# Patient Record
Sex: Female | Born: 1995
Health system: Southern US, Community
[De-identification: ages and names within clinical notes are randomized; demographics above are authoritative.]

## PROBLEM LIST (undated history)

## (undated) DIAGNOSIS — R519 Headache, unspecified: Secondary | ICD-10-CM

## (undated) DIAGNOSIS — R51 Headache: Secondary | ICD-10-CM

## (undated) HISTORY — DX: Headache: R51

## (undated) HISTORY — DX: Headache, unspecified: R51.9

---

## 2015-07-20 ENCOUNTER — Emergency Department (HOSPITAL_COMMUNITY)
Admission: EM | Admit: 2015-07-20 | Discharge: 2015-07-20 | Disposition: A | Discharge status: Home / Self Care | Payer: Self-pay | Source: Home / Self Care | Attending: Family Medicine | Admitting: Family Medicine

## 2015-07-20 ENCOUNTER — Encounter (HOSPITAL_COMMUNITY): Payer: Self-pay | Admitting: Emergency Medicine

## 2015-07-20 DIAGNOSIS — J069 Acute upper respiratory infection, unspecified: Secondary | ICD-10-CM

## 2015-07-20 LAB — POCT RAPID STREP A: STREPTOCOCCUS, GROUP A SCREEN (DIRECT): NEGATIVE

## 2015-07-20 MED ORDER — IPRATROPIUM BROMIDE 0.06 % NA SOLN: 2.0000 | Freq: Four times a day (QID) | NASAL | Status: DC

## 2015-07-20 MED ORDER — IBUPROFEN 800 MG PO TABS: 800.0000 mg | ORAL_TABLET | Freq: Three times a day (TID) | ORAL | Status: DC | PRN

## 2015-07-20 MED ORDER — BENZOCAINE (TOPICAL) 20 % EX AERO: 1.0000 "application " | INHALATION_SPRAY | Freq: Four times a day (QID) | CUTANEOUS | Status: DC | PRN

## 2015-07-20 NOTE — ED Notes (Signed)
Pt states she woke up in the middle of the night with a sore throat.  Since that time she is now suffering from nasal congestion, ear & head pressure, chest tightness, and generalized body weakness.  Pt denies any fever, but does state she had chills and sweats today.

## 2015-07-20 NOTE — ED Provider Notes (Signed)
CSN: [id number]     Arrival date & time 07/20/15  1523 History   First MD Initiated Contact with Patient 07/20/15 1736     Chief Complaint  Patient presents with  . Sore Throat  . URI   (Consider location/radiation/quality/duration/timing/severity/associated sxs/prior Treatment) HPI    Sore throat started at 06:00 today. Constant. Getting worse. Little po due to pain. Works at call center which makes it worse. Halls and hot tea and salt water w/o benefit.  Associated w/ runny nose and pressure in ears. HA feelign weak in general.  No fevers.     History reviewed. No pertinent past medical history. History reviewed. No pertinent past surgical history. Family History  Problem Relation Age of Onset  . Hypertension Other    Social History  Substance Use Topics  . Smoking status: Never Smoker   . Smokeless tobacco: None  . Alcohol Use: No   OB History    No data available     Review of Systems Per HPI with all other pertinent systems negative.   Allergies  Review of patient's allergies indicates no known allergies.  Home Medications   Prior to Admission medications   Medication Sig Start Date End Date Taking? Authorizing Provider  benzocaine (HURRICAINE) 20 % oral spray Use as directed 1 application in the mouth or throat 4 (four) times daily as needed for throat irritation / pain. 07/20/15   Ozella Rocks, MD  ibuprofen (ADVIL,MOTRIN) 800 MG tablet Take 1 tablet (800 mg total) by mouth every 8 (eight) hours as needed. 07/20/15   Ozella Rocks, MD  ipratropium (ATROVENT) 0.06 % nasal spray Place 2 sprays into both nostrils 4 (four) times daily. 07/20/15   Ozella Rocks, MD   Meds Ordered and Administered this Visit  Medications - No data to display  BP 94/64 mmHg  Pulse 111  Temp(Src) 99.6 F (37.6 C) (Oral)  Resp 16  SpO2 100%  LMP 07/15/2015 (Within Days) No data found.   Physical Exam Physical Exam  Constitutional: oriented to person, place, and time.  appears well-developed and well-nourished. No distress.  HENT:  Head: Normocephalic and atraumatic.  Eyes: EOMI. PERRL.  Minimal pharyngeal injection, tonsils 0 without exudate, patient actively sniffling in exam room. Neck: Normal range of motion.  Cardiovascular: RRR, no m/r/g, 2+ distal pulses,  Pulmonary/Chest: Effort normal and breath sounds normal. No respiratory distress.  Abdominal: Soft. Bowel sounds are normal. NonTTP, no distension.  Musculoskeletal: Normal range of motion. Non ttp, no effusion.  Neurological: alert and oriented to person, place, and time.  Skin: Skin is warm. No rash noted. non diaphoretic.  Psychiatric: normal mood and affect. behavior is normal. Judgment and thought content normal.   ED Course  Procedures (including critical care time)  Labs Review Labs Reviewed  POCT RAPID STREP A    Imaging Review No results found.   Visual Acuity Review  Right Eye Distance:   Left Eye Distance:   Bilateral Distance:    Right Eye Near:   Left Eye Near:    Bilateral Near:         MDM   1. Viral URI    Rapid strep negative  Strep Cx sent Atrovent, motrin, allergy medicine, hurricaine spray    Ozella Rocks, MD 07/20/15 [id number]

## 2015-07-20 NOTE — Discharge Instructions (Signed)
Your strep test was negative. You likely have a viral cold. Please start the nasal Atrovent, Hurricaine spray, Motrin 800 for symptoms. Please also consider using over-the-counter allergy medicine such as Zyrtec or Allegra at night before bed. We will call you if your second strep test comes back positive for antibiotic therapy.

## 2015-07-22 LAB — CULTURE, GROUP A STREP: Strep A Culture: NEGATIVE

## 2015-07-23 NOTE — ED Notes (Signed)
Final report of strep testing negative  

## 2018-07-13 DIAGNOSIS — Z3201 Encounter for pregnancy test, result positive: Secondary | ICD-10-CM | POA: Diagnosis not present

## 2018-07-13 DIAGNOSIS — Z3481 Encounter for supervision of other normal pregnancy, first trimester: Secondary | ICD-10-CM | POA: Diagnosis not present

## 2018-07-16 LAB — OB RESULTS CONSOLE GC/CHLAMYDIA
Chlamydia: NEGATIVE
Gonorrhea: NEGATIVE

## 2018-07-16 LAB — OB RESULTS CONSOLE RUBELLA ANTIBODY, IGM: Rubella: IMMUNE

## 2018-07-16 LAB — OB RESULTS CONSOLE ABO/RH: RH Type: POSITIVE

## 2018-07-16 LAB — OB RESULTS CONSOLE RPR: RPR: NONREACTIVE

## 2018-07-16 LAB — OB RESULTS CONSOLE HIV ANTIBODY (ROUTINE TESTING): HIV: NONREACTIVE

## 2018-07-16 LAB — OB RESULTS CONSOLE ANTIBODY SCREEN: Antibody Screen: NEGATIVE

## 2018-07-16 LAB — OB RESULTS CONSOLE HEPATITIS B SURFACE ANTIGEN: Hepatitis B Surface Ag: NEGATIVE

## 2018-07-19 DIAGNOSIS — Z3481 Encounter for supervision of other normal pregnancy, first trimester: Secondary | ICD-10-CM | POA: Diagnosis not present

## 2018-07-20 DIAGNOSIS — Z3687 Encounter for antenatal screening for uncertain dates: Secondary | ICD-10-CM | POA: Diagnosis not present

## 2018-08-16 DIAGNOSIS — Z23 Encounter for immunization: Secondary | ICD-10-CM | POA: Diagnosis not present

## 2018-09-18 DIAGNOSIS — Z36 Encounter for antenatal screening for chromosomal anomalies: Secondary | ICD-10-CM | POA: Diagnosis not present

## 2018-09-18 DIAGNOSIS — Z3482 Encounter for supervision of other normal pregnancy, second trimester: Secondary | ICD-10-CM | POA: Diagnosis not present

## 2018-10-24 NOTE — L&D Delivery Note (Signed)
Operative Delivery Note At 8:31 AM a viable female was delivered via Vaginal, Vacuum [employer] banker, operational).  Presentation: vertex; Position: Left,, Occiput,, Anterior; Station: +3.  Verbal consent: obtained from patient.  Risks and benefits discussed in detail.  Risks include, but are not limited to the risks of anesthesia, bleeding, infection, damage to maternal tissues, fetal cephalhematoma.  There is also the risk of inability to effect vaginal delivery of the head, or shoulder dystocia that cannot be resolved by established maneuvers, leading to the need for emergency cesarean section.  Vacuum applied- one pull with delivery of fetal head.  Minor shoulder dystocia noted.  Rotation performed with delivery of posterior arm.  Less than 1 min from fetal head to delivery of baby  APGAR: 9, 9; weight: pending Placenta status: path   Cord:  with the following complications: none.  Cord pH: n/a  Anesthesia:  epidural Instruments: Bell Episiotomy: None Lacerations: 2nd degree;Perineal Suture Repair: 2.0 3.0 vicryl Est. Blood Loss (mL): 173cc  Mom to postpartum.  Baby to Couplet care / Skin to Skin.  Alessandra Bevels Heylee Tant 02/11/2019, 9:25 AM

## 2018-11-06 DIAGNOSIS — Z3482 Encounter for supervision of other normal pregnancy, second trimester: Secondary | ICD-10-CM | POA: Diagnosis not present

## 2018-11-06 DIAGNOSIS — Z8349 Family history of other endocrine, nutritional and metabolic diseases: Secondary | ICD-10-CM | POA: Diagnosis not present

## 2018-11-12 DIAGNOSIS — Z3482 Encounter for supervision of other normal pregnancy, second trimester: Secondary | ICD-10-CM | POA: Diagnosis not present

## 2018-12-07 DIAGNOSIS — Z23 Encounter for immunization: Secondary | ICD-10-CM | POA: Diagnosis not present

## 2018-12-19 DIAGNOSIS — J029 Acute pharyngitis, unspecified: Secondary | ICD-10-CM | POA: Diagnosis not present

## 2018-12-21 DIAGNOSIS — Z3483 Encounter for supervision of other normal pregnancy, third trimester: Secondary | ICD-10-CM | POA: Diagnosis not present

## 2019-01-29 DIAGNOSIS — O26843 Uterine size-date discrepancy, third trimester: Secondary | ICD-10-CM | POA: Diagnosis not present

## 2019-02-05 ENCOUNTER — Telehealth (HOSPITAL_COMMUNITY): Payer: Self-pay | Admitting: *Deleted

## 2019-02-05 ENCOUNTER — Encounter (HOSPITAL_COMMUNITY): Payer: Self-pay | Admitting: *Deleted

## 2019-02-05 LAB — OB RESULTS CONSOLE GBS: GBS: NEGATIVE

## 2019-02-05 NOTE — Telephone Encounter (Signed)
Preadmission screen  

## 2019-02-10 ENCOUNTER — Encounter (HOSPITAL_COMMUNITY): Payer: Self-pay

## 2019-02-10 ENCOUNTER — Other Ambulatory Visit: Payer: Self-pay

## 2019-02-10 ENCOUNTER — Inpatient Hospital Stay (HOSPITAL_COMMUNITY): Payer: BLUE CROSS/BLUE SHIELD | Admitting: Anesthesiology

## 2019-02-10 ENCOUNTER — Inpatient Hospital Stay (HOSPITAL_COMMUNITY)
Admission: AD | Admit: 2019-02-10 | Discharge: 2019-02-13 | DRG: 807 | Disposition: A | Discharge status: Home / Self Care | Payer: BLUE CROSS/BLUE SHIELD | Attending: Obstetrics & Gynecology | Admitting: Obstetrics & Gynecology

## 2019-02-10 DIAGNOSIS — O99354 Diseases of the nervous system complicating childbirth: Secondary | ICD-10-CM | POA: Diagnosis not present

## 2019-02-10 DIAGNOSIS — Z3A40 40 weeks gestation of pregnancy: Secondary | ICD-10-CM | POA: Diagnosis not present

## 2019-02-10 DIAGNOSIS — O411290 Chorioamnionitis, unspecified trimester, not applicable or unspecified: Secondary | ICD-10-CM | POA: Diagnosis not present

## 2019-02-10 DIAGNOSIS — R51 Headache: Secondary | ICD-10-CM | POA: Diagnosis not present

## 2019-02-10 DIAGNOSIS — Z3A00 Weeks of gestation of pregnancy not specified: Secondary | ICD-10-CM | POA: Diagnosis not present

## 2019-02-10 DIAGNOSIS — O26893 Other specified pregnancy related conditions, third trimester: Secondary | ICD-10-CM | POA: Diagnosis not present

## 2019-02-10 DIAGNOSIS — O368390 Maternal care for abnormalities of the fetal heart rate or rhythm, unspecified trimester, not applicable or unspecified: Secondary | ICD-10-CM | POA: Diagnosis not present

## 2019-02-10 DIAGNOSIS — O99214 Obesity complicating childbirth: Secondary | ICD-10-CM | POA: Diagnosis not present

## 2019-02-10 DIAGNOSIS — Z23 Encounter for immunization: Secondary | ICD-10-CM | POA: Diagnosis not present

## 2019-02-10 LAB — URINALYSIS, ROUTINE W REFLEX MICROSCOPIC
Bilirubin Urine: NEGATIVE
Glucose, UA: NEGATIVE mg/dL
Hgb urine dipstick: NEGATIVE
Ketones, ur: 80 mg/dL — AB
Leukocytes,Ua: NEGATIVE
Nitrite: NEGATIVE
Protein, ur: NEGATIVE mg/dL
Specific Gravity, Urine: 1.014 (ref 1.005–1.030)
pH: 6 (ref 5.0–8.0)

## 2019-02-10 LAB — CBC
HCT: 38.6 % (ref 36.0–46.0)
Hemoglobin: 12.3 g/dL (ref 12.0–15.0)
MCH: 29.1 pg (ref 26.0–34.0)
MCHC: 31.9 g/dL (ref 30.0–36.0)
MCV: 91.3 fL (ref 80.0–100.0)
Platelets: 202 10*3/uL (ref 150–400)
RBC: 4.23 MIL/uL (ref 3.87–5.11)
RDW: 13.8 % (ref 11.5–15.5)
WBC: 11.2 10*3/uL — ABNORMAL HIGH (ref 4.0–10.5)
nRBC: 0 % (ref 0.0–0.2)

## 2019-02-10 LAB — TYPE AND SCREEN
ABO/RH(D): A POS
Antibody Screen: NEGATIVE

## 2019-02-10 MED ORDER — PHENYLEPHRINE 40 MCG/ML (10ML) SYRINGE FOR IV PUSH (FOR BLOOD PRESSURE SUPPORT): 80.0000 ug | PREFILLED_SYRINGE | INTRAVENOUS | Status: DC | PRN

## 2019-02-10 MED ORDER — LIDOCAINE HCL (PF) 1 % IJ SOLN
INTRAMUSCULAR | Status: DC | PRN
  Administered 2019-02-10 (×2): 5 mL via EPIDURAL

## 2019-02-10 MED ORDER — OXYCODONE-ACETAMINOPHEN 5-325 MG PO TABS: 1.0000 | ORAL_TABLET | ORAL | Status: DC | PRN

## 2019-02-10 MED ORDER — OXYTOCIN BOLUS FROM INFUSION
500.0000 mL | Freq: Once | INTRAVENOUS | Status: AC
  Administered 2019-02-11: 500 mL via INTRAVENOUS

## 2019-02-10 MED ORDER — FENTANYL-BUPIVACAINE-NACL 0.5-0.125-0.9 MG/250ML-% EP SOLN
12.0000 mL/h | EPIDURAL | Status: DC | PRN
  Filled 2019-02-10: qty 250

## 2019-02-10 MED ORDER — DIPHENHYDRAMINE HCL 50 MG/ML IJ SOLN: 12.5000 mg | INTRAMUSCULAR | Status: DC | PRN

## 2019-02-10 MED ORDER — OXYCODONE-ACETAMINOPHEN 5-325 MG PO TABS: 2.0000 | ORAL_TABLET | ORAL | Status: DC | PRN

## 2019-02-10 MED ORDER — EPHEDRINE 5 MG/ML INJ: 10.0000 mg | INTRAVENOUS | Status: DC | PRN

## 2019-02-10 MED ORDER — LACTATED RINGERS IV SOLN
INTRAVENOUS | Status: DC
  Administered 2019-02-10 – 2019-02-11 (×3): via INTRAVENOUS

## 2019-02-10 MED ORDER — ONDANSETRON HCL 4 MG/2ML IJ SOLN: 4.0000 mg | Freq: Four times a day (QID) | INTRAMUSCULAR | Status: DC | PRN

## 2019-02-10 MED ORDER — LACTATED RINGERS IV SOLN
INTRAVENOUS | Status: DC
  Administered 2019-02-10 – 2019-02-11 (×2): via INTRAVENOUS

## 2019-02-10 MED ORDER — SOD CITRATE-CITRIC ACID 500-334 MG/5ML PO SOLN: 30.0000 mL | ORAL | Status: DC | PRN

## 2019-02-10 MED ORDER — OXYTOCIN 40 UNITS IN NORMAL SALINE INFUSION - SIMPLE MED
2.5000 [IU]/h | INTRAVENOUS | Status: DC
  Filled 2019-02-10: qty 1000

## 2019-02-10 MED ORDER — TERBUTALINE SULFATE 1 MG/ML IJ SOLN: 0.2500 mg | Freq: Once | INTRAMUSCULAR | Status: DC | PRN

## 2019-02-10 MED ORDER — SODIUM CHLORIDE (PF) 0.9 % IJ SOLN
INTRAMUSCULAR | Status: DC | PRN
  Administered 2019-02-10: 12 mL/h via EPIDURAL

## 2019-02-10 MED ORDER — LACTATED RINGERS IV SOLN
500.0000 mL | Freq: Once | INTRAVENOUS | Status: AC
  Administered 2019-02-10: 17:00:00 500 mL via INTRAVENOUS

## 2019-02-10 MED ORDER — ACETAMINOPHEN 325 MG PO TABS: 650.0000 mg | ORAL_TABLET | ORAL | Status: DC | PRN

## 2019-02-10 MED ORDER — OXYTOCIN 40 UNITS IN NORMAL SALINE INFUSION - SIMPLE MED
1.0000 m[IU]/min | INTRAVENOUS | Status: DC
  Administered 2019-02-10 – 2019-02-11 (×2): 2 m[IU]/min via INTRAVENOUS

## 2019-02-10 MED ORDER — LIDOCAINE HCL (PF) 1 % IJ SOLN: 30.0000 mL | INTRAMUSCULAR | Status: DC | PRN

## 2019-02-10 MED ORDER — LACTATED RINGERS IV SOLN
500.0000 mL | INTRAVENOUS | Status: DC | PRN
  Administered 2019-02-10: 500 mL via INTRAVENOUS

## 2019-02-10 NOTE — MAU Note (Signed)
Ctxs since 2100. Denies LOF or bleeding. Cervix closed last sve

## 2019-02-10 NOTE — MAU Provider Note (Signed)
None    S: Sonya Kelly is a 23 y.o. G1P0 at [redacted]w[redacted]d  who presents to MAU today complaining of contractions. She denies vaginal bleeding. She denies LOF. She reports normal fetal movement.    O: BP 113/65 (BP Location: Right Arm)   Pulse 97   Temp 98.3 F (36.8 C)   Resp 18   Ht 5' (1.524 m)   Wt 103.4 kg   LMP 05/06/2018   BMI 44.53 kg/m  GENERAL: Well-developed, well-nourished female in no acute distress.  HEAD: Normocephalic, atraumatic.  CHEST: Normal effort of breathing, regular heart rate ABDOMEN: Soft, nontender, gravid  Cervical exam:  Dilation: 1.5 Effacement (%): 70 Cervical Position: Posterior Station: -3 Presentation: Undeterminable Exam by:: Marvel Plan RN    Fetal Monitoring: Baseline: 140 bpm Variability: Moderate  Accelerations: 15x15 Decelerations: variable decelerations  Contractions: Irregular pattern.    Spoke to [facility] CNM regarding fetal tracing. Admit orders placed. GBS negative.    A: SIUP at [redacted]w[redacted]d  Variable decelerations @ [redacted] weeks gestation   P:  Admit to labor and delivery.  GBS negative [facility] to manage    Rasch, Harolyn Rutherford, NP 02/10/2019 8:28 AM     Pt informed that the ultrasound is considered a limited OB ultrasound and is not intended to be a complete ultrasound exam.  Patient also informed that the ultrasound is not being completed with the intent of assessing for fetal or placental anomalies or any pelvic abnormalities.  Explained that the purpose of today's ultrasound is to assess for  presentation.  Patient acknowledges the purpose of the exam and the limitations of the study.

## 2019-02-10 NOTE — Progress Notes (Addendum)
Labor Progress Note  Sonya Kelly is a 23 y.o. 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 Pt was seen in MAU first and noted to having a Cat 2 strip with late decels noted, Pt stated cxt started last night @ 9pm. Pt endorse + Fm. Denies vaginal leakage. Denies vaginal bleeding. A+ Blood, antibody neg, hep b -, rubella immune, RPR NR, HIV neg, Hgb @ NOB 11.9, 28 week 11.4, failed 1hour, 143, passed 3 hour GTT. ITD on tdap and flu. BMI 40. Marland KitchenGBS-. Anatomy normal @ 20 weeks, Baby Boy in pt circ desired.  Subjective: Pt stable in chair with husband at bedside. Pt feeling cxt, more intense, but still feels spread out, pt coping well. Reviwed R&B of pitocin with pt, pt verbalized agreement to starting pitocin.   Patient Active Problem List   Diagnosis Date Noted  . Indication for care in labor and delivery, antepartum 02/10/2019   Objective: BP 125/72   Pulse 91   Temp 98.2 F (36.8 C) (Oral)   Resp 18   Ht 5' (1.524 m)   Wt 103.4 kg   LMP 05/06/2018   BMI 44.53 kg/m  No intake/output data recorded. No intake/output data recorded. NST: FHR baseline 140 bpm, Variability: moderate, Accelerations:present, Decelerations:  Absent= Cat 1/Reactive CTX:  irregular, every 5-8 minutes, lasting 60-120 second Uterus gravid, soft non tender, moderate to palpate with contractions.  SVE:  Dilation: 4 Effacement (%): 90 Station: -1 Exam by:: [employer] CNM Pitocin at (Plan to start now) mUn/min  Assessment:  Sonya Kelly is a 23 y.o. 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 Pt was seen in MAU first and noted to having a Cat 2 strip with late decels noted, Pt stated cxt started last night @ 9pm. Pt endorse + Fm. Denies vaginal leakage. Denies vaginal bleeding. A+ Blood, antibody neg, hep b -, rubella immune, RPR NR, HIV neg, Hgb @ NOB 11.9, 28 week 11.4, failed 1hour, 143, passed 3  hour GTT. ITD on tdap and flu. BMI 40. Marland KitchenGBS-. Anatomy normal @ 20 weeks, Baby Boy in pt circ desired. Pt stable progressing on own, but protracted, cxt irregular, started pit now. Pt comfortable  Patient Active Problem List   Diagnosis Date Noted  . Indication for care in labor and delivery, antepartum 02/10/2019   NICHD: Category 1  Membranes:  Intact, no s/s of infection  Induction:   Pitocin - 1  Pain management:               IV pain management: x0             Epidural placement: To be placed now.   GBS negative  Plan: Continue labor plan Continuous monitoring Rest Frequent position changes to facilitate fetal rotation and descent. Will reassess with cervical exam in 4 hours or earlier if necessary Start pitocin per protocol Place epidural now.  Anticipate labor progression and vaginal delivery.   [facility] aware of plan and verbalized agreement.   Dale [place], NP-C, CNM, MSN 02/10/2019. 3:46 PM

## 2019-02-10 NOTE — Anesthesia Procedure Notes (Signed)
Epidural Patient location during procedure: OB Start time: 02/10/2019 5:37 PM End time: 02/10/2019 6:06 PM  Staffing Anesthesiologist: Jairo Ben, MD Performed: anesthesiologist   Preanesthetic Checklist Completed: patient identified, surgical consent, pre-op evaluation, timeout performed, IV checked, risks and benefits discussed and monitors and equipment checked  Epidural Patient position: sitting Prep: site prepped and draped and DuraPrep Patient monitoring: blood pressure, continuous pulse ox and heart rate Approach: midline Location: L3-L4 Injection technique: LOR air  Needle:  Needle type: Tuohy  Needle gauge: 17 G Needle length: 9 cm Needle insertion depth: 6.5 cm Catheter type: closed end flexible Catheter size: 19 Gauge Catheter at skin depth: 11.5 cm Test dose: negative (1% lidocaine)  Assessment Events: blood not aspirated, injection not painful, no injection resistance, negative IV test and no paresthesia  Additional Notes Pt identified in Labor room.  Monitors applied. Working IV access confirmed. Sterile prep, drape lumbar spine.  1% lido local L 3,4.  #17ga Touhy LOR air at 6.5 cm L 3,4, cath in easily to 11.5 cm skin. Test dose OK, cath dosed and infusion begun.  Patient asymptomatic, VSS, no heme aspirated, tolerated well.  Sandford Craze, MDReason for block:procedure for pain

## 2019-02-10 NOTE — H&P (Signed)
Lynnell Kassa is a 23 y.o. 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 Pt was seen in MAU first and noted to having a Cat 2 strip with late decels noted, Pt stated cxt started last night @ 9pm. Pt endorse + Fm. Denies vaginal leakage. Denies vaginal bleeding. A+ Blood, antibody neg, hep b -, rubella immune, RPR NR, HIV neg, Hgb @ NOB 11.9, 28 week 11.4, failed 1hour, 143, passed 3 hour GTT. ITD on tdap and flu. BMI 40. Marland KitchenGBS-. Anatomy normal @ 20 weeks, Baby Boy in pt circ desired.  Patient Active Problem List   Diagnosis Date Noted  . Indication for care in labor and delivery, antepartum 02/10/2019    Medications Prior to Admission  Medication Sig Dispense Refill Last Dose  . benzocaine (HURRICAINE) 20 % oral spray Use as directed 1 application in the mouth or throat 4 (four) times daily as needed for throat irritation / pain. 57 mL 0   . ibuprofen (ADVIL,MOTRIN) 800 MG tablet Take 1 tablet (800 mg total) by mouth every 8 (eight) hours as needed. 30 tablet 0   . ipratropium (ATROVENT) 0.06 % nasal spray Place 2 sprays into both nostrils 4 (four) times daily. 15 mL 12     Past Medical History:  Diagnosis Date  . Headache      No current facility-administered medications on file prior to encounter.    Current Outpatient Medications on File Prior to Encounter  Medication Sig Dispense Refill  . benzocaine (HURRICAINE) 20 % oral spray Use as directed 1 application in the mouth or throat 4 (four) times daily as needed for throat irritation / pain. 57 mL 0  . ibuprofen (ADVIL,MOTRIN) 800 MG tablet Take 1 tablet (800 mg total) by mouth every 8 (eight) hours as needed. 30 tablet 0  . ipratropium (ATROVENT) 0.06 % nasal spray Place 2 sprays into both nostrils 4 (four) times daily. 15 mL 12     No Known Allergies  OB History    Gravida  1   Para  0   Term  0   Preterm  0   AB  0   Living  0     SAB  0   TAB  0   Ectopic   0   Multiple  0   Live Births  0          Past Medical History:  Diagnosis Date  . Headache    History reviewed. No pertinent surgical history. Family History: family history includes Hypertension in her father, maternal grandmother, and mother; Kidney disease in her paternal grandfather; Stroke in her paternal grandfather. Social History:  reports that she has never smoked. She has never used smokeless tobacco. She reports that she does not drink alcohol or use drugs.   Prenatal Transfer Tool  Maternal Diabetes: No Genetic Screening: Normal Maternal Ultrasounds/Referrals: Normal Fetal Ultrasounds or other Referrals:  None Maternal Substance Abuse:  No Significant Maternal Medications:  None Significant Maternal Lab Results: Lab values include: Group B Strep negative  ROS:  Review of Systems  Constitutional: Negative.   HENT: Negative.   Eyes: Negative.   Respiratory: Negative.   Cardiovascular: Negative.   Gastrointestinal: Positive for abdominal pain.  Genitourinary: Negative.   Musculoskeletal: Negative.   Skin: Negative.   Neurological: Negative.   Endo/Heme/Allergies: Negative.   Psychiatric/Behavioral: Negative.      Physical Exam: BP 114/62   Pulse 94   Temp 98  F (36.7 C) (Oral)   Resp 18   Ht 5' (1.524 m)   Wt 103.4 kg   LMP 05/06/2018   BMI 44.53 kg/m   Physical Exam  Constitutional: She is oriented to person, place, and time and well-developed, well-nourished, and in no distress.  HENT:  Head: Normocephalic and atraumatic.  Eyes: Pupils are equal, round, and reactive to light. Conjunctivae are normal.  Neck: Normal range of motion. Neck supple.  Cardiovascular: Normal rate and regular rhythm.  Pulmonary/Chest: Effort normal and breath sounds normal.  Abdominal: Soft. Bowel sounds are normal.  Genitourinary:    Cervix normal.     Genitourinary Comments: Pelvis adequate, uterus gravida equal to dates. Soft non-tender.    Musculoskeletal:  Normal range of motion.  Neurological: She is alert and oriented to person, place, and time. Gait normal.  Skin: Skin is warm and dry.  Psychiatric: Affect normal.  Nursing note and vitals reviewed.    NST: FHR baseline 140 bpm, Variability: moderate, Accelerations:present, Decelerations:  Absent= Cat 1/Reactive UC:   irregular, every 2-6 minutes, lasting 80-120 second, moderate to palpate.  SVE:   Dilation: 3 Effacement (%): 80 Station: Ballotable Exam by:: [facility] Hospital[facility] Kamori Kitchens CNM, vertex verified by fetal sutures.  Leopold's: Position vertex, EFW 6.5lbs via leopold's.  Bedside US showed OP baby.   Labs: Results for orders placed or performed during the hospital encounter of 02/10/19 (from the past 24 hour(s))  CBC     Status: Abnormal   Collection Time: 02/10/19  9:10 AM  Result Value Ref Range   WBC 11.2 (H) 4.0 - 10.5 K/uL   RBC 4.23 3.87 - 5.11 MIL/uL   Hemoglobin 12.3 12.0 - 15.0 g/dL   HCT 69.638.6 29.536.0 - 28.446.0 %   MCV 91.3 80.0 - 100.0 fL   MCH 29.1 26.0 - 34.0 pg   MCHC 31.9 30.0 - 36.0 g/dL   RDW 13.213.8 44.011.5 - 10.215.5 %   Platelets 202 150 - 400 K/uL   nRBC 0.0 0.0 - 0.2 %  Type and screen [facility]     Status: None (Preliminary result)   Collection Time: 02/10/19  9:10 AM  Result Value Ref Range   ABO/RH(D) PENDING    Antibody Screen PENDING    Sample Expiration      02/13/2019 Performed at [facility], [address] [place], [place] [address]   Urinalysis, Routine w reflex microscopic     Status: Abnormal   Collection Time: 02/10/19  9:33 AM  Result Value Ref Range   Color, Urine YELLOW YELLOW   APPearance CLEAR CLEAR   Specific Gravity, Urine 1.014 1.005 - 1.030   pH 6.0 5.0 - 8.0   Glucose, UA NEGATIVE NEGATIVE mg/dL   Hgb urine dipstick NEGATIVE NEGATIVE   Bilirubin Urine NEGATIVE NEGATIVE   Ketones, ur 80 (A) NEGATIVE mg/dL   Protein, ur NEGATIVE NEGATIVE mg/dL   Nitrite NEGATIVE NEGATIVE   Leukocytes,Ua NEGATIVE NEGATIVE    Imaging:   No results found.  MAU Course: Orders Placed This Encounter  Procedures  . CBC  . RPR  . Urinalysis, Routine w reflex microscopic  . Vitals signs per unit policy  . Notify Physician  . Fetal monitoring per unit policy  . Activity as tolerated  . Cervical Exam  . Measure blood pressure post delivery every 15 min x 1 hour then every 30 min x 1 hour  . Fundal check post delivery every 15 min x 1 hour then every  30 min x 1 hour  . If Rapid HIV test positive or known HIV positive: initiate AZT orders  . May in and out cath x 2 for inability to void  . Insert foley catheter  . Discontinue foley prior to vaginal delivery  . Initiate Carrier Fluid Protocol  . Initiate Oral Care Protocol  . Order Rapid HIV per protocol if no results on chart  . SCDs  . Full code  . Oxygen therapy  . Type and screen [facility]  . Insert and maintain IV Line  . Admit to Inpatient (patient's expected length of stay will be greater than 2 midnights or inpatient only procedure)   Meds ordered this encounter  Medications  . lactated ringers infusion  . lactated ringers infusion  . oxytocin (PITOCIN) IV BOLUS FROM BAG  . oxytocin (PITOCIN) IV infusion 40 units in NS 1000 mL - Premix  . lactated ringers infusion 500-1,000 mL  . acetaminophen (TYLENOL) tablet 650 mg  . oxyCODONE-acetaminophen (PERCOCET/ROXICET) 5-325 MG per tablet 1 tablet  . oxyCODONE-acetaminophen (PERCOCET/ROXICET) 5-325 MG per tablet 2 tablet  . ondansetron (ZOFRAN) injection 4 mg  . sodium citrate-citric acid (ORACIT) solution 30 mL  . lidocaine (PF) (XYLOCAINE) 1 % injection 30 mL    Assessment/Plan: Onetta Ortwein is a 23 y.o. 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 Pt was seen in MAU first and noted to having a Cat 2 strip with late decels noted, Pt stated cxt started last night @ 9pm. Pt endorse + Fm. Denies vaginal leakage. Denies vaginal bleeding.  A+ Blood, antibody neg, hep b -, rubella immune, RPR NR, HIV neg, Hgb @ NOB 11.9, 28 week 11.4, failed 1hour, 143, passed 3 hour GTT. ITD on tdap and flu. BMI 40. Marland KitchenGBS-. Anatomy normal @ 20 weeks, Baby Boy in pt circ desired.  FWB: Cat 1 Fetal Tracing.   Plan: Admit to [facility] per consult with Dr Sallye Ober Routine [facility] orders Pain med/epidural prn Use birthing ball or shower for pain relief, options given to tpt about pain management, pt aware of pain meds and epidural choices.  Allow natural labor to progress for now.  Anticipate labor progression   Dale [place] NP-C, CNM, MSN 02/10/2019, 11:14 AM

## 2019-02-10 NOTE — Progress Notes (Signed)
Labor Progress Note  Sonya Kelly is a 23 y.o. 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 Pt was seen in MAU first and noted to having a Cat 2 strip with late decels noted, Pt stated cxt started last night @ 9pm. Pt endorse + Fm. Denies vaginal leakage. Denies vaginal bleeding. A+ Blood, antibody neg, hep b -, rubella immune, RPR NR, HIV neg, Hgb @ NOB 11.9, 28 week 11.4, failed 1hour, 143, passed 3 hour GTT. ITD on tdap and flu. BMI 40. Marland KitchenGBS-. Anatomy normal @ 20 weeks, Baby Boy in pt circ desired.  Subjective: Called by RN @ 2020 notified about variables and late decels, maternal position change and fluid bolus going, pitocin turned off, decles slight resolved, pt stable.   @ 2045 pt stable, reviewed R&B about AROM, pt verbalized understanding and consented to procedure, pt declined any questions.   Pt in bed and comfortable with epidural.   Patient Active Problem List   Diagnosis Date Noted  . Indication for care in labor and delivery, antepartum 02/10/2019   Objective: BP 122/68   Pulse 92   Temp 98.7 F (37.1 C) (Oral)   Resp 18   Ht 5' (1.524 m)   Wt 103.4 kg   LMP 05/06/2018   BMI 44.53 kg/m  No intake/output data recorded. No intake/output data recorded. NST: FHR baseline 140 bpm, Variability: moderate, Accelerations:present, Decelerations:  Absent= Cat 2/Reactive CTX:  irregular, every 3-5 minutes, lasting 50-90 second Uterus gravid, soft non tender, moderate to palpate with contractions.  SVE:  Dilation: 8 Effacement (%): 100 Station: -2 Exam by:: J Nikiya Starn, CNM Pitocin at (was at 6, turned off for variables with late decels) mUn/min  Assessment:  Sonya Kelly is a 23 y.o. 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 Pt was seen in MAU first and noted to having a Cat 2 strip with late decels noted, Pt stated cxt started last night @ 9pm. Pt endorse +  Fm. Denies vaginal leakage. Denies vaginal bleeding. A+ Blood, antibody neg, hep b -, rubella immune, RPR NR, HIV neg, Hgb @ NOB 11.9, 28 week 11.4, failed 1hour, 143, passed 3 hour GTT. ITD on tdap and flu. BMI 40. GBS-. Anatomy normal @ 20 weeks, Baby Boy in pt circ desired. Pt stable and comfortable with in bed, late's and variables noted, AROM tolerated well.  Patient Active Problem List   Diagnosis Date Noted  . Indication for care in labor and delivery, antepartum 02/10/2019   NICHD: Category 2  Membranes:  Arom, clear, moderate @ 2045, no s/s of infection  Induction:   Pitocin - Was at 6, turned off due to decels.   Pain management:               IV pain management: x0             Epidural placement: Placed @  1850 on 4/19  GBS negative  Plan: Continue labor plan Continuous monitoring Rest Frequent position changes to facilitate fetal rotation and descent. Will reassess with cervical exam in 2 hours or earlier if necessary May start pitocin if cat 1 established.  Place epidural now.  Anticipate labor progression and vaginal delivery.   Md Pinn to be updated.   Dale [place], NP-C, CNM, MSN 02/10/2019. 10:44 PM

## 2019-02-10 NOTE — Anesthesia Preprocedure Evaluation (Signed)
Anesthesia Evaluation  Patient identified by MRN, date of birth, ID band Patient awake    Reviewed: Allergy & Precautions, NPO status , Patient's Chart, lab work & pertinent test results  History of Anesthesia Complications Negative for: history of anesthetic complications  Airway Mallampati: II  TM Distance: >3 FB Neck ROM: Full    Dental  (+) Dental Advisory Given   Pulmonary neg pulmonary ROS,    breath sounds clear to auscultation       Cardiovascular negative cardio ROS   Rhythm:Regular Rate:Normal     Neuro/Psych  Headaches,    GI/Hepatic Neg liver ROS, GERD  ,  Endo/Other  Morbid obesity  Renal/GU negative Renal ROS     Musculoskeletal   Abdominal (+) + obese,   Peds  Hematology plt 202k   Anesthesia Other Findings   Reproductive/Obstetrics (+) Pregnancy                             Anesthesia Physical Anesthesia Plan  ASA: II  Anesthesia Plan: Epidural   Post-op Pain Management:    Induction:   PONV Risk Score and Plan: 2 and Treatment may vary due to age or medical condition  Airway Management Planned: Natural Airway  Additional Equipment:   Intra-op Plan:   Post-operative Plan:   Informed Consent: I have reviewed the patients History and Physical, chart, labs and discussed the procedure including the risks, benefits and alternatives for the proposed anesthesia with the patient or authorized representative who has indicated his/her understanding and acceptance.       Plan Discussed with:   Anesthesia Plan Comments: (Patient identified. Risks/Benefits/Options discussed with patient including but not limited to bleeding, infection, nerve damage, paralysis, failed block, incomplete pain control, headache, blood pressure changes, nausea, vomiting, reactions to medication both or allergic, itching and postpartum back pain. Confirmed with bedside nurse the patient's  most recent platelet count. Confirmed with patient that they are not currently taking any anticoagulation, have any bleeding history or any family history of bleeding disorders. Patient expressed understanding and wished to proceed. All questions were answered. )        Anesthesia Quick Evaluation

## 2019-02-10 NOTE — MAU Note (Signed)
Bedside US performed at 0823 by NP for presentation.  Vertex presentation confirmed.

## 2019-02-10 NOTE — MAU Note (Signed)
Ms. Sonya Kelly is a [redacted]w[redacted]d G1P0 at [redacted]w[redacted]d who presents to MAU today with complaint of contractions q 5-8 since 2100. She denies vaginal bleeding. She denies LOF . Her GBS status is negative. She desires epidural.   BP 113/65 (BP Location: Right Arm)   Pulse 97   Temp 98.3 F (36.8 C)   Resp 18   Ht 5' (1.524 m)   Wt 103.4 kg   LMP 05/06/2018   BMI 44.53 kg/m  Dilation: 1.5 Effacement (%): 70 Cervical Position: Posterior Station: -3 Presentation: Undeterminable Exam by:: Marvel Plan RN   Report called to on-call MD for admission Verbal orders taken Patient transported to L&D

## 2019-02-11 ENCOUNTER — Encounter (HOSPITAL_COMMUNITY): Payer: Self-pay | Admitting: *Deleted

## 2019-02-11 LAB — RPR: RPR Ser Ql: NONREACTIVE

## 2019-02-11 LAB — ABO/RH: ABO/RH(D): A POS

## 2019-02-11 MED ORDER — PRENATAL MULTIVITAMIN CH
1.0000 | ORAL_TABLET | Freq: Every day | ORAL | Status: DC
  Administered 2019-02-11 – 2019-02-13 (×3): 1 via ORAL
  Filled 2019-02-11 (×3): qty 1

## 2019-02-11 MED ORDER — DIPHENHYDRAMINE HCL 25 MG PO CAPS: 25.0000 mg | ORAL_CAPSULE | Freq: Four times a day (QID) | ORAL | Status: DC | PRN

## 2019-02-11 MED ORDER — DIBUCAINE (PERIANAL) 1 % EX OINT: 1.0000 "application " | TOPICAL_OINTMENT | CUTANEOUS | Status: DC | PRN

## 2019-02-11 MED ORDER — ONDANSETRON HCL 4 MG/2ML IJ SOLN: 4.0000 mg | INTRAMUSCULAR | Status: DC | PRN

## 2019-02-11 MED ORDER — ACETAMINOPHEN 325 MG PO TABS: 650.0000 mg | ORAL_TABLET | ORAL | Status: DC | PRN

## 2019-02-11 MED ORDER — ENOXAPARIN SODIUM 60 MG/0.6ML ~~LOC~~ SOLN
0.5000 mg/kg | SUBCUTANEOUS | Status: DC
  Administered 2019-02-12 – 2019-02-13 (×2): 50 mg via SUBCUTANEOUS
  Filled 2019-02-11 (×2): qty 0.6

## 2019-02-11 MED ORDER — IBUPROFEN 600 MG PO TABS
600.0000 mg | ORAL_TABLET | Freq: Four times a day (QID) | ORAL | Status: DC
  Administered 2019-02-11 – 2019-02-13 (×9): 600 mg via ORAL
  Filled 2019-02-11 (×9): qty 1

## 2019-02-11 MED ORDER — SIMETHICONE 80 MG PO CHEW: 80.0000 mg | CHEWABLE_TABLET | ORAL | Status: DC | PRN

## 2019-02-11 MED ORDER — COCONUT OIL OIL: 1.0000 "application " | TOPICAL_OIL | Status: DC | PRN

## 2019-02-11 MED ORDER — BENZOCAINE-MENTHOL 20-0.5 % EX AERO
1.0000 "application " | INHALATION_SPRAY | CUTANEOUS | Status: DC | PRN
  Administered 2019-02-11: 1 via TOPICAL
  Filled 2019-02-11 (×2): qty 56

## 2019-02-11 MED ORDER — SENNOSIDES-DOCUSATE SODIUM 8.6-50 MG PO TABS
2.0000 | ORAL_TABLET | ORAL | Status: DC
  Administered 2019-02-11 – 2019-02-13 (×2): 2 via ORAL
  Filled 2019-02-11 (×2): qty 2

## 2019-02-11 MED ORDER — ZOLPIDEM TARTRATE 5 MG PO TABS: 5.0000 mg | ORAL_TABLET | Freq: Every evening | ORAL | Status: DC | PRN

## 2019-02-11 MED ORDER — ONDANSETRON HCL 4 MG PO TABS: 4.0000 mg | ORAL_TABLET | ORAL | Status: DC | PRN

## 2019-02-11 MED ORDER — WITCH HAZEL-GLYCERIN EX PADS: 1.0000 "application " | MEDICATED_PAD | CUTANEOUS | Status: DC | PRN

## 2019-02-11 NOTE — Progress Notes (Addendum)
Labor Progress Note  Sonya Kelly is a 23 y.o. 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 Pt was seen in MAU first and noted to having a Cat 2 strip with late decels noted, Pt stated cxt started last night @ 9pm. Pt endorse + Fm. Denies vaginal leakage. Denies vaginal bleeding. A+ Blood, antibody neg, hep b -, rubella immune, RPR NR, HIV neg, Hgb @ NOB 11.9, 28 week 11.4, failed 1hour, 143, passed 3 hour GTT. ITD on tdap and flu. BMI 40. Marland KitchenGBS-. Anatomy normal @ 20 weeks, Baby Boy in pt circ desired.  Subjective: Pt in bed and comfortable with epidural. Occasional variables noted. Reviewed R&B of IUPC and FSE placement, pt aware and verbally consented, denies any questions. Pt not feeling pressure yet.   Patient Active Problem List   Diagnosis Date Noted  . Indication for care in labor and delivery, antepartum 02/10/2019   Objective: BP 136/89   Pulse 99   Temp 98.3 F (36.8 C) (Oral)   Resp 18   Ht 5' (1.524 m)   Wt 103.4 kg   LMP 05/06/2018   BMI 44.53 kg/m  No intake/output data recorded. Total I/O In: -  Out: 1150 [Urine:1150] NST: FHR baseline 150 bpm, Variability: moderate, Accelerations:present, Decelerations:  Variables with early decels noted= Cat 2/Reactive with scalp stim, variables noted.  CTX:  irregular, every 3-5 minutes, lasting 80-100 second Uterus gravid, soft non tender, moderate to palpate with contractions.  SVE:  Dilation: 8.5 Effacement (%): 100 Station: -2 Exam by:: J Tomma Ehinger CNM Pitocin at (6) mUn/min  IUPC placed, MVU 230, pt tolerated well FSE placed, fetus tolerated well.   Assessment:  Sonya Kelly is a 23 y.o. 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 Pt was seen in MAU first and noted to having a Cat 2 strip with late decels noted, Pt stated cxt started last night @ 9pm. Pt endorse + Fm. Denies vaginal leakage. Denies vaginal  bleeding. A+ Blood, antibody neg, hep b -, rubella immune, RPR NR, HIV neg, Hgb @ NOB 11.9, 28 week 11.4, failed 1hour, 143, passed 3 hour GTT. ITD on tdap and flu. BMI 40. GBS-. Anatomy normal @ 20 weeks, Baby Boy in pt circ desired. Pt stable and comfortable with in bed,  variables noted, progressing on pitocin, placed iupc and fse for monitoring, pt tolerated well.  Patient Active Problem List   Diagnosis Date Noted  . Indication for care in labor and delivery, antepartum 02/10/2019   NICHD: Category 2 with variables noted, at times Cat 1 noted. Reactive to fetal scalp stimulation.   Membranes:  Arom, clear, moderate @ 2045, no s/s of infection  Induction:   Pitocin -6   Pain management:               IV pain management: x0             Epidural placement: Placed @  1850 on 4/19  GBS negative  Plan: Continue labor plan Continuous monitoring Rest Frequent position changes to facilitate fetal rotation and descent. Will reassess with cervical exam in 2 hours or earlier if necessary Restart Pitocin 2x2 now, titrate to fetal tolerance.   Anticipate labor progression and vaginal delivery.   Md Pinn to be updated.    Dale Snyder, NP-C, CNM, MSN 02/11/2019. 4:59 AM   Addeddum 6:41 AM, Dr Mora Appl consulted due to pt having variable  with pushing for 10 mins, @ 0540 pt was found to be complete, pt stable can feel when she could push but denies the urge, triol of pushing occurred for 10 mins each time variable noted with cat 2 strip, pt left to labor down in high fowlers, RN to  Give fluid bolus. Dr Charlotta Newtonzan was given report and assumed care of pt.

## 2019-02-11 NOTE — Anesthesia Postprocedure Evaluation (Signed)
Anesthesia Post Note  Patient: Estate agent  Procedure(s) Performed: AN AD HOC LABOR EPIDURAL     Patient location during evaluation: Mother Baby Anesthesia Type: Epidural Level of consciousness: awake and alert and oriented Pain management: satisfactory to patient Vital Signs Assessment: post-procedure vital signs reviewed and stable Respiratory status: respiratory function stable Cardiovascular status: stable Postop Assessment: no headache, no backache, epidural receding, patient able to bend at knees, no signs of nausea or vomiting and adequate PO intake Anesthetic complications: no    Last Vitals:  Vitals:   02/11/19 1010 02/11/19 1100  BP: 130/83 114/76  Pulse: (!) 115 (!) 113  Resp: 20 18  Temp: 37.5 C 37.4 C    Last Pain:  Vitals:   02/11/19 1140  TempSrc:   PainSc: 3    Pain Goal:                   Julie-Anne Torain

## 2019-02-11 NOTE — Progress Notes (Signed)
OB PN:  S: Pt resting comfortably, no acute complaints  O: BP 132/72   Pulse (!) 103   Temp 98.3 F (36.8 C) (Oral)   Resp 18   Ht 5' (1.524 m)   Wt 103.4 kg   LMP 05/06/2018   BMI 44.53 kg/m   FHT: 160bpm, moderate variablity, + accels, occasional variable and late decels to 130bpm with spontaneous recovery Toco: q2-48min SVE: 9/100/-1  A/P: 23 y.o. G1P0 @ 106w1d in active labor 1. FWB: Cat. II- pt repositioned, will continue to closely monitor 2. Labor: IUPC and FSE placed for appropriate Pitocin titration, if decels continue will discontinue Pitocin and discuss management plan Pain: continue epidural GBS: negative  Myna Hidalgo, DO [phone] (cell) [phone] (office)

## 2019-02-11 NOTE — Progress Notes (Signed)
Labor Progress Note  Sonya Kelly is a 23 y.o. 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 Pt was seen in MAU first and noted to having a Cat 2 strip with late decels noted, Pt stated cxt started last night @ 9pm. Pt endorse + Fm. Denies vaginal leakage. Denies vaginal bleeding. A+ Blood, antibody neg, hep b -, rubella immune, RPR NR, HIV neg, Hgb @ NOB 11.9, 28 week 11.4, failed 1hour, 143, passed 3 hour GTT. ITD on tdap and flu. BMI 40. Marland KitchenGBS-. Anatomy normal @ 20 weeks, Baby Boy in pt circ desired.  Subjective: Pt in bed and comfortable with epidural. Occasional variables noted, no cervical changed noted.   Patient Active Problem List   Diagnosis Date Noted  . Indication for care in labor and delivery, antepartum 02/10/2019   Objective: BP (!) 106/58   Pulse 100   Temp 98.3 F (36.8 C) (Oral)   Resp 18   Ht 5' (1.524 m)   Wt 103.4 kg   LMP 05/06/2018   BMI 44.53 kg/m  No intake/output data recorded. No intake/output data recorded. NST: FHR baseline 145 bpm, Variability: moderate, Accelerations:present, Decelerations:  Absent= Cat 1-2/Reactive, variables noted.  CTX:  irregular, every 3-5 minutes, lasting 80-100 second Uterus gravid, soft non tender, moderate to palpate with contractions.  SVE:  Dilation: 7 Effacement (%): 100 Station: -2 Exam by:: B Holliday RN  Pitocin at (start again @ 2) mUn/min  Assessment:  Sonya Kelly is a 23 y.o. 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 Pt was seen in MAU first and noted to having a Cat 2 strip with late decels noted, Pt stated cxt started last night @ 9pm. Pt endorse + Fm. Denies vaginal leakage. Denies vaginal bleeding. A+ Blood, antibody neg, hep b -, rubella immune, RPR NR, HIV neg, Hgb @ NOB 11.9, 28 week 11.4, failed 1hour, 143, passed 3 hour GTT. ITD on tdap and flu. BMI 40. GBS-. Anatomy normal @ 20 weeks, Baby  Boy in pt circ desired. Pt stable and comfortable with in bed,  variables noted, no cervical change noted.  Patient Active Problem List   Diagnosis Date Noted  . Indication for care in labor and delivery, antepartum 02/10/2019   NICHD: Category 2 with variables noted, at times Cat 1 noted. Reactive to fetal scalp stimulation.   Membranes:  Arom, clear, moderate @ 2045, no s/s of infection  Induction:   Pitocin - 2  Pain management:               IV pain management: x0             Epidural placement: Placed @  1850 on 4/19  GBS negative  Plan: Continue labor plan Continuous monitoring Rest Frequent position changes to facilitate fetal rotation and descent. Will reassess with cervical exam in 4 hours or earlier if necessary Restart Pitocin 2x2 now, titrate to fetal tolerance.  Anticipate IUPC if no cervical change next check  Anticipate labor progression and vaginal delivery.   Md Pinn updated.   Dale Hetland, NP-C, CNM, MSN 02/11/2019. 1:13 AM

## 2019-02-12 ENCOUNTER — Encounter (HOSPITAL_COMMUNITY): Payer: Self-pay

## 2019-02-12 LAB — CBC
HCT: 33.3 % — ABNORMAL LOW (ref 36.0–46.0)
Hemoglobin: 10.5 g/dL — ABNORMAL LOW (ref 12.0–15.0)
MCH: 29.1 pg (ref 26.0–34.0)
MCHC: 31.5 g/dL (ref 30.0–36.0)
MCV: 92.2 fL (ref 80.0–100.0)
Platelets: 186 10*3/uL (ref 150–400)
RBC: 3.61 MIL/uL — ABNORMAL LOW (ref 3.87–5.11)
RDW: 13.9 % (ref 11.5–15.5)
WBC: 16.8 10*3/uL — ABNORMAL HIGH (ref 4.0–10.5)
nRBC: 0 % (ref 0.0–0.2)

## 2019-02-12 NOTE — Progress Notes (Signed)
Postpartum day #1, NSVD  Subjective Pt without complaints.  Lochia normal.  Pain controlled.  Breast feeding no.  Desire outpatient circumcision and Nexplanon for contraception postpartum. Pt would like 24 hour discharge but after discussion with RN, baby is being tested for jaundice and will not likely be discharged.  Temp:  [97.7 F (36.5 C)-98.2 F (36.8 C)] 98.2 F (36.8 C) (04/21 1409) Pulse Rate:  [93-103] 93 (04/21 1409) Resp:  [16-18] 16 (04/21 1409) BP: (89-106)/(54-72) 99/72 (04/21 1409) SpO2:  [100 %] 100 % (04/20 1700)  Gen:  NAD, A&O x 3 Uterine fundus:  Firm, nontender Lochia normal Ext:  +Edema, no calf tenderness bilaterally  CBC    Component Value Date/Time   WBC 16.8 (H) 02/12/2019 0522   RBC 3.61 (L) 02/12/2019 0522   HGB 10.5 (L) 02/12/2019 0522   HCT 33.3 (L) 02/12/2019 0522   PLT 186 02/12/2019 0522   MCV 92.2 02/12/2019 0522   MCH 29.1 02/12/2019 0522   MCHC 31.5 02/12/2019 0522   RDW 13.9 02/12/2019 0522     A/P: S/p SVD doing well. Routine postpartum care. Discharge in am. Outpatient circumcision.  [name] 02/12/2019, 3:03 PM

## 2019-02-12 NOTE — Progress Notes (Signed)
Postpartum day #1, NSVD  Reviewed VS, labs.   CBC    Component Value Date/Time   WBC 16.8 (H) 02/12/2019 0522   RBC 3.61 (L) 02/12/2019 0522   HGB 10.5 (L) 02/12/2019 0522   HCT 33.3 (L) 02/12/2019 0522   PLT 186 02/12/2019 0522   MCV 92.2 02/12/2019 0522   MCH 29.1 02/12/2019 0522   MCHC 31.5 02/12/2019 0522   RDW 13.9 02/12/2019 0522      Shawna Wearing 02/12/2019, 7:23 AM

## 2019-02-13 MED ORDER — IBUPROFEN 600 MG PO TABS: 600.0000 mg | ORAL_TABLET | Freq: Four times a day (QID) | ORAL | 0 refills | Status: DC | PRN

## 2019-02-13 MED ORDER — DOCUSATE SODIUM 100 MG PO CAPS: 100.0000 mg | ORAL_CAPSULE | Freq: Two times a day (BID) | ORAL | 0 refills | Status: DC

## 2019-02-13 NOTE — Discharge Summary (Signed)
OB Discharge Summary     Patient Name: Sonya Kelly DOB: Aug 17, 1996 MRN: [id number]  Date of admission: 02/10/2019 Delivering MD: Sonya Kelly   Date of discharge: 02/13/2019  Admitting diagnosis: pregnancy Intrauterine pregnancy: 5672w1d     Secondary diagnosis:  Active Problems:   Indication for care in labor and delivery, antepartum  Additional problems: Morbid obesity     Discharge diagnosis: Term Pregnancy Delivered                                                                                                Post partum procedures:none  Augmentation: AROM and Pitocin  Complications: None  Hospital course:  Onset of Labor With Vaginal Delivery     23 y.o. yo [id number] at 5772w1d was admitted in Latent Labor on 02/10/2019. Patient had an uncomplicated labor course as follows:  Membrane Rupture Time/Date: 7:00 PM ,02/10/2019   Intrapartum Procedures: Episiotomy: None [1]                                         Lacerations:  2nd degree [3];Perineal [11]  Patient had a delivery of a Viable infant. 02/11/2019  Information for the patient's newborn:  Sonya Kelly, Boy Sonya Kelly [[id number]][[id number]]  Delivery Method: Vaginal, Vacuum (Extractor)(Filed from Delivery Summary)    Pateint had an uncomplicated postpartum course.  She is ambulating, tolerating a regular diet, passing flatus, and urinating well. Patient is discharged home in stable condition on 02/13/19.   Physical exam  Vitals:   02/11/19 2341 02/12/19 0611 02/12/19 1409 02/12/19 2138  BP: 106/64 (!) 89/67 99/72 112/70  Pulse: (!) 101  93 83  Resp: 18 18 16 18   Temp: 98.1 F (36.7 C) 97.7 F (36.5 C) 98.2 F (36.8 C) 98.2 F (36.8 C)  TempSrc: Oral Oral Oral Oral  SpO2:      Weight:      Height:       General: alert, cooperative and no distress Lochia: appropriate Uterine Fundus: firm Incision: N/A DVT Evaluation: No evidence of DVT seen on physical exam. Labs: Lab Results  Component Value Date   WBC 16.8 (H)  02/12/2019   HGB 10.5 (L) 02/12/2019   HCT 33.3 (L) 02/12/2019   MCV 92.2 02/12/2019   PLT 186 02/12/2019   No flowsheet data found.  Discharge instruction: per After Visit Summary and "Baby and Me Booklet".  After visit meds:  Allergies as of 02/13/2019   No Known Allergies     Medication List    STOP taking these medications   benzocaine 20 % oral spray Commonly known as:  HURRICAINE   ipratropium 0.06 % nasal spray Commonly known as:  Atrovent     TAKE these medications   calcium carbonate 500 MG chewable tablet Commonly known as:  TUMS - dosed in mg elemental calcium Chew 1 tablet by mouth as needed for indigestion or heartburn.   docusate sodium 100 MG capsule Commonly known as:  Colace Take 1 capsule (100 mg total) by mouth  2 (two) times daily.   ibuprofen 600 MG tablet Commonly known as:  ADVIL Take 1 tablet (600 mg total) by mouth every 6 (six) hours as needed for mild pain or moderate pain. What changed:    medication strength  how much to take  when to take this  reasons to take this   prenatal multivitamin Tabs tablet Take 1 tablet by mouth daily at 12 noon.       Diet: routine diet  Activity: Advance as tolerated. Pelvic rest for 6 weeks.   Outpatient follow up:6 weeks Follow up Appt:No future appointments. Follow up Visit:No follow-ups on file.  Postpartum contraception: Undecided- between POPs or Nexplanon  Newborn Data: Live born female  Birth Weight: 7 lb 0.2 oz (3180 g) APGAR: 9, 9  Newborn Delivery   Birth date/time:  02/11/2019 08:31:00 Delivery type:  Vaginal, Vacuum (Extractor)     Baby Feeding: Bottle Disposition:home with mother   02/13/2019 Sonya Seller, DO

## 2019-02-13 NOTE — Discharge Instructions (Signed)
Vaginal Delivery, Care After °Refer to this sheet in the next few weeks. These instructions provide you with information about caring for yourself after vaginal delivery. Your health care provider may also give you more specific instructions. Your treatment has been planned according to current medical practices, but problems sometimes occur. Call your health care provider if you have any problems or questions. °What can I expect after the procedure? °After vaginal delivery, it is common to have: °· Some bleeding from your vagina. °· Soreness in your abdomen, your vagina, and the area of skin between your vaginal opening and your anus (perineum). °· Pelvic cramps. °· Fatigue. °Follow these instructions at home: °Medicines °· Take over-the-counter and prescription medicines only as told by your health care provider. °· If you were prescribed an antibiotic medicine, take it as told by your health care provider. Do not stop taking the antibiotic until it is finished. °Driving ° °· Do not drive or operate heavy machinery while taking prescription pain medicine. °· Do not drive for 24 hours if you received a sedative. °Lifestyle °· Do not drink alcohol. This is especially important if you are breastfeeding or taking medicine to relieve pain. °· Do not use tobacco products, including cigarettes, chewing tobacco, or e-cigarettes. If you need help quitting, ask your health care provider. °Eating and drinking °· Drink at least 8 eight-ounce glasses of water every day unless you are told not to by your health care provider. If you choose to breastfeed your baby, you may need to drink more water than this. °· Eat high-fiber foods every day. These foods may help prevent or relieve constipation. High-fiber foods include: °? Whole grain cereals and breads. °? Brown rice. °? Beans. °? Fresh fruits and vegetables. °Activity °· Return to your normal activities as told by your health care provider. Ask your health care provider what  activities are safe for you. °· Rest as much as possible. Try to rest or take a nap when your baby is sleeping. °· Do not lift anything that is heavier than your baby or 10 lb (4.5 kg) until your health care provider says that it is safe. °· Talk with your health care provider about when you can engage in sexual activity. This may depend on your: °? Risk of infection. °? Rate of healing. °? Comfort and desire to engage in sexual activity. °Vaginal Care °· If you have an episiotomy or a vaginal tear, check the area every day for signs of infection. Check for: °? More redness, swelling, or pain. °? More fluid or blood. °? Warmth. °? Pus or a bad smell. °· Do not use tampons or douches until your health care provider says this is safe. °· Watch for any blood clots that may pass from your vagina. These may look like clumps of dark red, brown, or black discharge. °General instructions °· Keep your perineum clean and dry as told by your health care provider. °· Wear loose, comfortable clothing. °· Wipe from front to back when you use the toilet. °· Ask your health care provider if you can shower or take a bath. If you had an episiotomy or a perineal tear during labor and delivery, your health care provider may tell you not to take baths for a certain length of time. °· Wear a bra that supports your breasts and fits you well. °· If possible, have someone help you with household activities and help care for your baby for at least a few days after you   leave the hospital. °· Keep all follow-up visits for you and your baby as told by your health care provider. This is important. °Contact a health care provider if: °· You have: °? Vaginal discharge that has a bad smell. °? Difficulty urinating. °? Pain when urinating. °? A sudden increase or decrease in the frequency of your bowel movements. °? More redness, swelling, or pain around your episiotomy or vaginal tear. °? More fluid or blood coming from your episiotomy or vaginal  tear. °? Pus or a bad smell coming from your episiotomy or vaginal tear. °? A fever. °? A rash. °? Little or no interest in activities you used to enjoy. °? Questions about caring for yourself or your baby. °· Your episiotomy or vaginal tear feels warm to the touch. °· Your episiotomy or vaginal tear is separating or does not appear to be healing. °· Your breasts are painful, hard, or turn red. °· You feel unusually sad or worried. °· You feel nauseous or you vomit. °· You pass large blood clots from your vagina. If you pass a blood clot from your vagina, save it to show to your health care provider. Do not flush blood clots down the toilet without having your health care provider look at them. °· You urinate more than usual. °· You are dizzy or light-headed. °· You have not breastfed at all and you have not had a menstrual period for 12 weeks after delivery. °· You have stopped breastfeeding and you have not had a menstrual period for 12 weeks after you stopped breastfeeding. °Get help right away if: °· You have: °? Pain that does not go away or does not get better with medicine. °? Chest pain. °? Difficulty breathing. °? Blurred vision or spots in your vision. °? Thoughts about hurting yourself or your baby. °· You develop pain in your abdomen or in one of your legs. °· You develop a severe headache. °· You faint. °· You bleed from your vagina so much that you fill two sanitary pads in one hour. °This information is not intended to replace advice given to you by your health care provider. Make sure you discuss any questions you have with your health care provider. °Document Released: 10/07/2000 Document Revised: 03/23/2016 Document Reviewed: 10/25/2015 °Elsevier Interactive Patient Education © 2019 [employer] ° °

## 2019-02-16 ENCOUNTER — Inpatient Hospital Stay (HOSPITAL_COMMUNITY): Payer: BLUE CROSS/BLUE SHIELD

## 2019-03-25 DIAGNOSIS — Z30017 Encounter for initial prescription of implantable subdermal contraceptive: Secondary | ICD-10-CM | POA: Diagnosis not present

## 2019-03-25 DIAGNOSIS — Z3202 Encounter for pregnancy test, result negative: Secondary | ICD-10-CM | POA: Diagnosis not present

## 2019-03-25 DIAGNOSIS — E049 Nontoxic goiter, unspecified: Secondary | ICD-10-CM | POA: Diagnosis not present

## 2019-06-28 DIAGNOSIS — E049 Nontoxic goiter, unspecified: Secondary | ICD-10-CM | POA: Diagnosis not present

## 2019-06-28 DIAGNOSIS — R110 Nausea: Secondary | ICD-10-CM | POA: Diagnosis not present

## 2019-06-28 DIAGNOSIS — Z975 Presence of (intrauterine) contraceptive device: Secondary | ICD-10-CM | POA: Diagnosis not present

## 2019-08-14 ENCOUNTER — Other Ambulatory Visit: Payer: Self-pay | Admitting: Family Medicine

## 2019-08-14 DIAGNOSIS — E049 Nontoxic goiter, unspecified: Secondary | ICD-10-CM

## 2019-10-21 ENCOUNTER — Ambulatory Visit
Admission: RE | Admit: 2019-10-21 | Discharge: 2019-10-21 | Disposition: A | Discharge status: Home / Self Care | Payer: BLUE CROSS/BLUE SHIELD | Source: Ambulatory Visit | Attending: Family Medicine | Admitting: Family Medicine

## 2019-10-21 DIAGNOSIS — E049 Nontoxic goiter, unspecified: Secondary | ICD-10-CM

## 2019-11-19 ENCOUNTER — Other Ambulatory Visit: Payer: Self-pay | Admitting: Internal Medicine

## 2019-11-20 ENCOUNTER — Other Ambulatory Visit (HOSPITAL_COMMUNITY): Payer: Self-pay | Admitting: Internal Medicine

## 2019-11-20 ENCOUNTER — Other Ambulatory Visit: Payer: Self-pay | Admitting: Internal Medicine

## 2019-11-20 DIAGNOSIS — E042 Nontoxic multinodular goiter: Secondary | ICD-10-CM

## 2019-11-20 DIAGNOSIS — R221 Localized swelling, mass and lump, neck: Secondary | ICD-10-CM

## 2019-11-21 ENCOUNTER — Other Ambulatory Visit (HOSPITAL_COMMUNITY): Payer: Self-pay | Admitting: Internal Medicine

## 2019-11-21 DIAGNOSIS — E042 Nontoxic multinodular goiter: Secondary | ICD-10-CM

## 2019-11-21 DIAGNOSIS — R221 Localized swelling, mass and lump, neck: Secondary | ICD-10-CM

## 2019-11-26 ENCOUNTER — Other Ambulatory Visit: Payer: Self-pay

## 2019-11-26 ENCOUNTER — Encounter (HOSPITAL_COMMUNITY)
Admission: RE | Admit: 2019-11-26 | Discharge: 2019-11-26 | Disposition: A | Discharge status: Home / Self Care | Payer: Medicaid Other | Source: Ambulatory Visit | Attending: Internal Medicine | Admitting: Internal Medicine

## 2019-11-26 DIAGNOSIS — R221 Localized swelling, mass and lump, neck: Secondary | ICD-10-CM | POA: Insufficient documentation

## 2019-11-26 DIAGNOSIS — E042 Nontoxic multinodular goiter: Secondary | ICD-10-CM | POA: Insufficient documentation

## 2019-11-26 MED ORDER — TECHNETIUM TC 99M SESTAMIBI - CARDIOLITE
26.1000 | Freq: Once | INTRAVENOUS | Status: AC | PRN
  Administered 2019-11-26: 12:00:00 26.1 via INTRAVENOUS

## 2019-12-05 ENCOUNTER — Other Ambulatory Visit (HOSPITAL_COMMUNITY): Payer: Self-pay | Admitting: Internal Medicine

## 2019-12-06 ENCOUNTER — Other Ambulatory Visit (HOSPITAL_COMMUNITY): Payer: Self-pay | Admitting: Internal Medicine

## 2019-12-06 DIAGNOSIS — E041 Nontoxic single thyroid nodule: Secondary | ICD-10-CM

## 2019-12-09 ENCOUNTER — Encounter (HOSPITAL_COMMUNITY): Payer: Self-pay

## 2019-12-09 NOTE — Progress Notes (Signed)
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  Provider Telephone: [phone]    Other Info: Rad Exam in Epic

## 2019-12-09 NOTE — Progress Notes (Signed)
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  Provider Telephone: [phone]    Other Info: Rad Exam in Epic

## 2019-12-18 ENCOUNTER — Ambulatory Visit (HOSPITAL_COMMUNITY): Payer: Medicaid Other

## 2019-12-20 ENCOUNTER — Other Ambulatory Visit: Payer: Self-pay

## 2019-12-20 ENCOUNTER — Ambulatory Visit (HOSPITAL_COMMUNITY)
Admission: RE | Admit: 2019-12-20 | Discharge: 2019-12-20 | Disposition: A | Discharge status: Home / Self Care | Payer: Medicaid Other | Source: Ambulatory Visit | Attending: Internal Medicine | Admitting: Internal Medicine

## 2019-12-20 DIAGNOSIS — E041 Nontoxic single thyroid nodule: Secondary | ICD-10-CM | POA: Insufficient documentation

## 2019-12-20 MED ORDER — LIDOCAINE HCL (PF) 1 % IJ SOLN
INTRAMUSCULAR | Status: AC
  Filled 2019-12-20: qty 30

## 2019-12-20 NOTE — Procedures (Signed)
Interventional Radiology Procedure Note  Procedure:  US guided right thyroid nodule biopsy. Afirma. .  Complications: None  Recommendations:  - Ok to shower tomorrow - Do not submerge for 7 days - DC now  Signed,  Yvone Neu. Loreta Ave, DO

## 2019-12-23 LAB — CYTOLOGY - NON PAP

## 2020-05-19 MED FILL — PHENTERMINE 15 MG CAPSULE: 15 | 30 days supply | Qty: 30 | Fill #0

## 2020-07-06 MED FILL — PHENTERMINE HCL 15 MG CAPS: 15 | 30 days supply | Qty: 30 | Fill #0

## 2020-07-28 ENCOUNTER — Other Ambulatory Visit (HOSPITAL_COMMUNITY): Payer: Self-pay | Admitting: Obstetrics & Gynecology

## 2020-07-28 MED FILL — VYLIBRA 0.25-35 MG-MCG TABS: 0.25-35 | 28 days supply | Qty: 28 | Fill #0

## 2020-11-05 IMAGING — NM NM PARATHYROID W/ SPECT
5 series · 20 of 20 positions shown · non-contrast
Comparison: None

CLINICAL DATA: Multinodular goiter, neck mass

EXAM:
NM PARATHYROID SCINTIGRAPHY AND SPECT IMAGING
TECHNIQUE: Following intravenous administration of radiopharmaceutical, early
and 2-hour delayed planar images were obtained in the anterior
projection. Delayed triplanar SPECT images were also obtained at 2
hours.
RADIOPHARMACEUTICALS:  26.1 mCi Kc-VVm Sestamibi IV

[Series 1: spect - (id)_(id)_tra · 4.1mm · 4.14mm/px · 6 of 128 frames shown]
[frame 11/128]
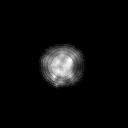
[frame 32/128]
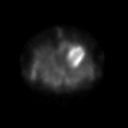
[frame 54/128]
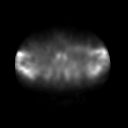
[frame 75/128]
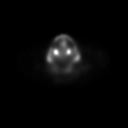
[frame 96/128]
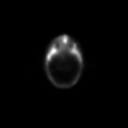
[frame 118/128]
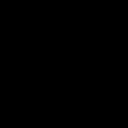

[Series 1: 15 min ant · 4.14mm/px · 1 of 1 slices shown]
[im 1/1]
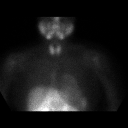

[Series 1: spect - (id)_(id)_cor · 4.1mm · 4.14mm/px · 6 of 128 frames shown]
[frame 11/128]
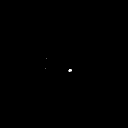
[frame 32/128]
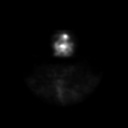
[frame 54/128]
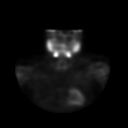
[frame 75/128]
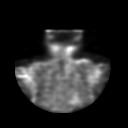
[frame 96/128]
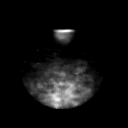
[frame 118/128]
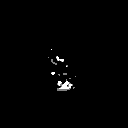

[Series 2: 2 hr ant · 4.14mm/px · 1 of 1 slices shown]
[im 1/1]
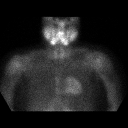

[Series 3: spect parathyroid · 4.14mm/px · 6 of 64 frames shown]
[frame 6/64  full-range]
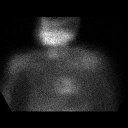
[frame 16/64  full-range]
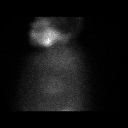
[frame 27/64  full-range]
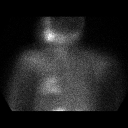
[frame 38/64  full-range]
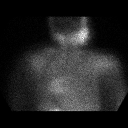
[frame 48/64  full-range]
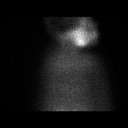
[frame 59/64  full-range]
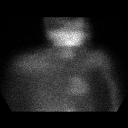

[20 of 20 positions shown; findings below may reference images not displayed]

FINDINGS: Planar imaging: Normal initial distribution of sestamibi in the
thyroid lobes. Normal washout of tracer on delayed image at 2 hours.
No abnormal sestamibi retention identified at the expected positions
of the parathyroid glands.

SPECT imaging: Expected residual tracer within the thyroid lobes. No
abnormal sestamibi retention at the expected positions of the
parathyroid glands to suggest parathyroid adenoma. No ectopic
localization of tracer in the mediastinum.
IMPRESSION: Negative parathyroid scan.

## 2020-11-19 ENCOUNTER — Encounter (HOSPITAL_COMMUNITY): Payer: Self-pay

## 2020-11-19 ENCOUNTER — Other Ambulatory Visit (HOSPITAL_COMMUNITY): Payer: Medicaid Other

## 2021-08-04 ENCOUNTER — Other Ambulatory Visit: Payer: Self-pay | Admitting: Internal Medicine

## 2021-08-04 DIAGNOSIS — E042 Nontoxic multinodular goiter: Secondary | ICD-10-CM

## 2022-04-11 ENCOUNTER — Other Ambulatory Visit (HOSPITAL_COMMUNITY): Payer: Self-pay

## 2022-06-13 ENCOUNTER — Other Ambulatory Visit (HOSPITAL_COMMUNITY)
Admission: RE | Admit: 2022-06-13 | Discharge: 2022-06-13 | Disposition: A | Discharge status: Home / Self Care | Payer: Medicaid Other | Source: Ambulatory Visit | Attending: Nurse Practitioner | Admitting: Nurse Practitioner

## 2022-06-13 DIAGNOSIS — Z124 Encounter for screening for malignant neoplasm of cervix: Secondary | ICD-10-CM | POA: Diagnosis present

## 2022-06-13 DIAGNOSIS — Z01419 Encounter for gynecological examination (general) (routine) without abnormal findings: Secondary | ICD-10-CM | POA: Diagnosis not present

## 2022-06-16 LAB — CYTOLOGY - PAP
Comment: NEGATIVE
Diagnosis: NEGATIVE
High risk HPV: NEGATIVE

## 2022-10-12 LAB — OB RESULTS CONSOLE HEPATITIS B SURFACE ANTIGEN: Hepatitis B Surface Ag: NEGATIVE

## 2022-10-12 LAB — OB RESULTS CONSOLE ABO/RH: RH Type: POSITIVE

## 2022-10-12 LAB — OB RESULTS CONSOLE HIV ANTIBODY (ROUTINE TESTING): HIV: NONREACTIVE

## 2022-10-12 LAB — HEPATITIS C ANTIBODY: HCV Ab: NEGATIVE

## 2022-10-12 LAB — OB RESULTS CONSOLE RUBELLA ANTIBODY, IGM: Rubella: IMMUNE

## 2022-10-26 LAB — OB RESULTS CONSOLE GC/CHLAMYDIA
Chlamydia: NEGATIVE
Neisseria Gonorrhea: NEGATIVE

## 2023-01-31 LAB — OB RESULTS CONSOLE RPR: RPR: NONREACTIVE

## 2023-04-20 LAB — OB RESULTS CONSOLE GBS: GBS: POSITIVE

## 2023-05-04 ENCOUNTER — Encounter (HOSPITAL_COMMUNITY): Payer: Self-pay | Admitting: *Deleted

## 2023-05-04 ENCOUNTER — Telehealth (HOSPITAL_COMMUNITY): Payer: Self-pay | Admitting: *Deleted

## 2023-05-04 NOTE — Telephone Encounter (Signed)
Preadmission screen  

## 2023-05-04 NOTE — H&P (Signed)
HPI: 27 y.o. [id number] @ [redacted]w[redacted]d estimated gestational age (as dated by [redacted]w[redacted]d ultrasound) presents for induction of labor for LGA (EFW 96% and AC greater than 98%), BMI 40, and history of shoulder dystocia.   Of note, patient strongly desires bilateral salpingectomy at the time of cesarean section IF indicated. Otherwise, desires interval bilateral salpingectomy.  Leakage of fluid:  No Vaginal bleeding:  No Contractions:  No Fetal movement:  Yes  Prenatal care has been provided by Dr. Katrinka Blazing. Connye Burkitt [facility]).  ROS:  Denies fevers, chills, chest pain, visual changes, SOB, RUQ/epigastric pain, N/V, dysuria, hematuria, or sudden onset/worsening bilateral LE or facial edema.  Pregnancy complicated by: Obesity (BMI 40) Hx OVD Hx shoulder dystocia (less than one minute, rotation performed with delivery of posterior arm) Abnormal glucola with normal 3h GTT Headaches (Fioricet PRN) Desires permanent sterilization  Prenatal Transfer Tool  Maternal Diabetes: No Genetic Screening: Normal - Low risk female, declines MSAFP Maternal Ultrasounds/Referrals: Other: LGA fetus Fetal Ultrasounds or other Referrals:  Other: Serial growth [place] Maternal Substance Abuse:  No Significant Maternal Medications:  None Significant Maternal Lab Results: Group B Strep positive   Prenatal Labs Blood type:  A Positive Antibody screen:  Negative CBC:  H/H 10.9/32.9 Rubella: Immune RPR:  Non-reactive Hep B:  Negative Hep C:  Negative HIV:  Negative GC/CT:  Negative Glucola:  141.1, 3h GTT normal  Immunizations: Tdap:  Given prenatally Flu: Received last flu season  Contraceptive plan: Bilateral salpingectomy  OBHx:  OB History     Gravida  4   Para  1   Term  1   Preterm  0   AB  2   Living  1      SAB  0   IAB  0   Ectopic  0   Multiple  0   Live Births  1          PMHx:  See above Meds:  PNV Allergy:  No Known Allergies SurgHx: Dilation and suction  curettage SocHx:   Denies Tobacco, ETOH, illicit drugs  O: There were no vitals taken for this visit. Gen. AAOx3, NAD CV.  RRR  Resp. CTAB, no wheezes/rales/rhonchi Abd. Gravid, soft, non-tender throughout, no rebound/guarding Extr.  1+ bilateral LE edema, no calf tenderness bilaterally SVE: 2/75/-3   Last [place] (05/04/23):  [redacted]w[redacted]d, EFW 3973g, 8lb 12oz (96%), AC greater than 98%, AAFV, cephalic, anterior placenta  Labs: see orders  A/P:  27 y.o. [id number] @ [redacted]w[redacted]d who is admitted for induction of labor for LGA (EFW 96% and AC greater than 98%), BMI 40, and history of shoulder dystocia..  - Admit to L&D - Admit labs (CBC, T&S, RPR) - CEFM/Toco - Diet:  Clear liquids - IVF:  LR at 125cc/hour - VTE Prophylaxis:  SCDs - GBS Status:  Positive -- PCN ordered - Presentation:  Confirm prior to IOL - Pain control:  Per patient request - Induction method:  Cytotec for cervical ripening - Anticipate SVD  Steva Ready, DO

## 2023-05-12 ENCOUNTER — Inpatient Hospital Stay (HOSPITAL_COMMUNITY)
Admission: RE | Admit: 2023-05-12 | Discharge: 2023-05-16 | DRG: 784 | Disposition: A | Discharge status: Home / Self Care | Payer: Medicaid Other | Attending: Obstetrics and Gynecology | Admitting: Obstetrics and Gynecology

## 2023-05-12 DIAGNOSIS — O99824 Streptococcus B carrier state complicating childbirth: Secondary | ICD-10-CM | POA: Diagnosis present

## 2023-05-12 DIAGNOSIS — O339 Maternal care for disproportion, unspecified: Secondary | ICD-10-CM | POA: Diagnosis present

## 2023-05-12 DIAGNOSIS — O9081 Anemia of the puerperium: Secondary | ICD-10-CM | POA: Diagnosis not present

## 2023-05-12 DIAGNOSIS — O99214 Obesity complicating childbirth: Secondary | ICD-10-CM | POA: Diagnosis present

## 2023-05-12 DIAGNOSIS — Z3A39 39 weeks gestation of pregnancy: Secondary | ICD-10-CM

## 2023-05-12 DIAGNOSIS — O3660X0 Maternal care for excessive fetal growth, unspecified trimester, not applicable or unspecified: Principal | ICD-10-CM | POA: Diagnosis present

## 2023-05-12 DIAGNOSIS — D62 Acute posthemorrhagic anemia: Secondary | ICD-10-CM | POA: Diagnosis not present

## 2023-05-12 DIAGNOSIS — O3663X0 Maternal care for excessive fetal growth, third trimester, not applicable or unspecified: Secondary | ICD-10-CM | POA: Diagnosis not present

## 2023-05-12 DIAGNOSIS — Z302 Encounter for sterilization: Secondary | ICD-10-CM | POA: Diagnosis not present

## 2023-05-12 MED ORDER — LACTATED RINGERS IV SOLN: INTRAVENOUS | Status: DC

## 2023-05-12 MED ORDER — SOD CITRATE-CITRIC ACID 500-334 MG/5ML PO SOLN
30.0000 mL | ORAL | Status: DC | PRN
  Administered 2023-05-13 (×3): 30 mL via ORAL
  Filled 2023-05-12 (×3): qty 30

## 2023-05-12 MED ORDER — ONDANSETRON HCL 4 MG/2ML IJ SOLN: 4.0000 mg | Freq: Four times a day (QID) | INTRAMUSCULAR | Status: DC | PRN

## 2023-05-12 MED ORDER — HYDROXYZINE HCL 50 MG PO TABS: 50.0000 mg | ORAL_TABLET | Freq: Four times a day (QID) | ORAL | Status: DC | PRN

## 2023-05-12 MED ORDER — PENICILLIN G POT IN DEXTROSE 60000 UNIT/ML IV SOLN
3.0000 10*6.[IU] | INTRAVENOUS | Status: DC
  Administered 2023-05-13 (×5): 3 10*6.[IU] via INTRAVENOUS
  Filled 2023-05-12 (×5): qty 50

## 2023-05-12 MED ORDER — OXYTOCIN-SODIUM CHLORIDE 30-0.9 UT/500ML-% IV SOLN: 2.5000 [IU]/h | INTRAVENOUS | Status: DC

## 2023-05-12 MED ORDER — SODIUM CHLORIDE 0.9 % IV SOLN
5.0000 10*6.[IU] | Freq: Once | INTRAVENOUS | Status: AC
  Administered 2023-05-13: 5 10*6.[IU] via INTRAVENOUS
  Filled 2023-05-12: qty 5

## 2023-05-12 MED ORDER — OXYCODONE-ACETAMINOPHEN 5-325 MG PO TABS: 1.0000 | ORAL_TABLET | ORAL | Status: DC | PRN

## 2023-05-12 MED ORDER — OXYTOCIN-SODIUM CHLORIDE 30-0.9 UT/500ML-% IV SOLN
1.0000 m[IU]/min | INTRAVENOUS | Status: DC
  Administered 2023-05-13: 2 m[IU]/min via INTRAVENOUS
  Filled 2023-05-12: qty 500

## 2023-05-12 MED ORDER — ACETAMINOPHEN 325 MG PO TABS: 650.0000 mg | ORAL_TABLET | ORAL | Status: DC | PRN

## 2023-05-12 MED ORDER — TERBUTALINE SULFATE 1 MG/ML IJ SOLN: 0.2500 mg | Freq: Once | INTRAMUSCULAR | Status: DC | PRN

## 2023-05-12 MED ORDER — LACTATED RINGERS IV SOLN
500.0000 mL | INTRAVENOUS | Status: DC | PRN
  Administered 2023-05-13: 500 mL via INTRAVENOUS

## 2023-05-12 MED ORDER — MISOPROSTOL 25 MCG QUARTER TABLET
25.0000 ug | ORAL_TABLET | ORAL | Status: DC | PRN
  Administered 2023-05-13 (×2): 25 ug via VAGINAL
  Filled 2023-05-12 (×4): qty 1

## 2023-05-12 MED ORDER — FENTANYL CITRATE (PF) 100 MCG/2ML IJ SOLN: 50.0000 ug | INTRAMUSCULAR | Status: DC | PRN

## 2023-05-12 MED ORDER — OXYTOCIN BOLUS FROM INFUSION: 333.0000 mL | Freq: Once | INTRAVENOUS | Status: DC

## 2023-05-12 MED ORDER — LIDOCAINE HCL (PF) 1 % IJ SOLN: 30.0000 mL | INTRAMUSCULAR | Status: DC | PRN

## 2023-05-12 MED ORDER — OXYCODONE-ACETAMINOPHEN 5-325 MG PO TABS: 2.0000 | ORAL_TABLET | ORAL | Status: DC | PRN

## 2023-05-13 ENCOUNTER — Inpatient Hospital Stay (HOSPITAL_COMMUNITY): Payer: Medicaid Other | Admitting: Anesthesiology

## 2023-05-13 ENCOUNTER — Inpatient Hospital Stay (HOSPITAL_COMMUNITY): Payer: Medicaid Other

## 2023-05-13 ENCOUNTER — Encounter (HOSPITAL_COMMUNITY): Payer: Self-pay | Admitting: Obstetrics and Gynecology

## 2023-05-13 ENCOUNTER — Encounter (HOSPITAL_COMMUNITY)
Admission: RE | Disposition: A | Discharge status: Home / Self Care | Payer: Self-pay | Source: Home / Self Care | Attending: Obstetrics and Gynecology

## 2023-05-13 ENCOUNTER — Other Ambulatory Visit: Payer: Self-pay

## 2023-05-13 DIAGNOSIS — O3663X0 Maternal care for excessive fetal growth, third trimester, not applicable or unspecified: Secondary | ICD-10-CM

## 2023-05-13 DIAGNOSIS — Z302 Encounter for sterilization: Secondary | ICD-10-CM

## 2023-05-13 DIAGNOSIS — Z3A39 39 weeks gestation of pregnancy: Secondary | ICD-10-CM

## 2023-05-13 LAB — CBC
HCT: 33.2 % — ABNORMAL LOW (ref 36.0–46.0)
Hemoglobin: 10.5 g/dL — ABNORMAL LOW (ref 12.0–15.0)
MCH: 28.5 pg (ref 26.0–34.0)
MCHC: 31.6 g/dL (ref 30.0–36.0)
MCV: 90 fL (ref 80.0–100.0)
Platelets: 167 10*3/uL (ref 150–400)
RBC: 3.69 MIL/uL — ABNORMAL LOW (ref 3.87–5.11)
RDW: 14.6 % (ref 11.5–15.5)
WBC: 9.5 10*3/uL (ref 4.0–10.5)
nRBC: 0 % (ref 0.0–0.2)

## 2023-05-13 LAB — TYPE AND SCREEN: ABO/RH(D): A POS

## 2023-05-13 LAB — RPR: RPR Ser Ql: NONREACTIVE

## 2023-05-13 SURGERY — Surgical Case
Anesthesia: Epidural | Site: Uterus

## 2023-05-13 MED ORDER — SODIUM BICARBONATE 8.4 % IV SOLN
INTRAVENOUS | Status: DC | PRN
  Administered 2023-05-13 (×2): 5 mL via EPIDURAL

## 2023-05-13 MED ORDER — FENTANYL-BUPIVACAINE-NACL 0.5-0.125-0.9 MG/250ML-% EP SOLN
12.0000 mL/h | EPIDURAL | Status: DC | PRN
  Filled 2023-05-13: qty 250

## 2023-05-13 MED ORDER — FENTANYL CITRATE (PF) 100 MCG/2ML IJ SOLN
INTRAMUSCULAR | Status: AC
  Filled 2023-05-13: qty 2

## 2023-05-13 MED ORDER — LACTATED RINGERS IV SOLN: INTRAVENOUS | Status: DC | PRN

## 2023-05-13 MED ORDER — DEXAMETHASONE SODIUM PHOSPHATE 10 MG/ML IJ SOLN
INTRAMUSCULAR | Status: DC | PRN
  Administered 2023-05-13: 10 mg via INTRAVENOUS

## 2023-05-13 MED ORDER — BUPIVACAINE LIPOSOME 1.3 % IJ SUSP
INTRAMUSCULAR | Status: AC
  Filled 2023-05-13: qty 20

## 2023-05-13 MED ORDER — EPHEDRINE 5 MG/ML INJ: 10.0000 mg | INTRAVENOUS | Status: DC | PRN

## 2023-05-13 MED ORDER — PHENYLEPHRINE 80 MCG/ML (10ML) SYRINGE FOR IV PUSH (FOR BLOOD PRESSURE SUPPORT)
PREFILLED_SYRINGE | INTRAVENOUS | Status: AC
  Filled 2023-05-13: qty 10

## 2023-05-13 MED ORDER — PHENYLEPHRINE 80 MCG/ML (10ML) SYRINGE FOR IV PUSH (FOR BLOOD PRESSURE SUPPORT)
80.0000 ug | PREFILLED_SYRINGE | INTRAVENOUS | Status: AC | PRN
  Administered 2023-05-13: 160 ug via INTRAVENOUS
  Administered 2023-05-13 (×2): 240 ug via INTRAVENOUS
  Administered 2023-05-13: 160 ug via INTRAVENOUS
  Administered 2023-05-14: 240 ug via INTRAVENOUS

## 2023-05-13 MED ORDER — MORPHINE SULFATE (PF) 0.5 MG/ML IJ SOLN
INTRAMUSCULAR | Status: DC | PRN
  Administered 2023-05-13: 3 mg via EPIDURAL

## 2023-05-13 MED ORDER — METOCLOPRAMIDE HCL 5 MG/ML IJ SOLN
INTRAMUSCULAR | Status: DC | PRN
  Administered 2023-05-13: 10 mg via INTRAVENOUS

## 2023-05-13 MED ORDER — PHENYLEPHRINE 80 MCG/ML (10ML) SYRINGE FOR IV PUSH (FOR BLOOD PRESSURE SUPPORT): 80.0000 ug | PREFILLED_SYRINGE | INTRAVENOUS | Status: DC | PRN

## 2023-05-13 MED ORDER — LIDOCAINE HCL (PF) 1 % IJ SOLN
INTRAMUSCULAR | Status: DC | PRN
  Administered 2023-05-13: 2 mL via EPIDURAL
  Administered 2023-05-13: 10 mL via EPIDURAL

## 2023-05-13 MED ORDER — MORPHINE SULFATE (PF) 0.5 MG/ML IJ SOLN
INTRAMUSCULAR | Status: AC
  Filled 2023-05-13: qty 10

## 2023-05-13 MED ORDER — TRANEXAMIC ACID-NACL 1000-0.7 MG/100ML-% IV SOLN
INTRAVENOUS | Status: DC | PRN
  Administered 2023-05-13: 1000 mg via INTRAVENOUS

## 2023-05-13 MED ORDER — CEFAZOLIN SODIUM-DEXTROSE 2-3 GM-%(50ML) IV SOLR
INTRAVENOUS | Status: DC | PRN
  Administered 2023-05-13: 2 g via INTRAVENOUS

## 2023-05-13 MED ORDER — ACETAMINOPHEN 10 MG/ML IV SOLN
INTRAVENOUS | Status: DC | PRN
  Administered 2023-05-13: 1000 mg via INTRAVENOUS

## 2023-05-13 MED ORDER — FENTANYL-BUPIVACAINE-NACL 0.5-0.125-0.9 MG/250ML-% EP SOLN
EPIDURAL | Status: DC | PRN
  Administered 2023-05-13: 12 mL/h via EPIDURAL

## 2023-05-13 MED ORDER — LACTATED RINGERS IV SOLN: 500.0000 mL | Freq: Once | INTRAVENOUS | Status: DC

## 2023-05-13 MED ORDER — ONDANSETRON HCL 4 MG/2ML IJ SOLN
INTRAMUSCULAR | Status: DC | PRN
  Administered 2023-05-13: 4 mg via INTRAVENOUS

## 2023-05-13 MED ORDER — OXYTOCIN-SODIUM CHLORIDE 30-0.9 UT/500ML-% IV SOLN
INTRAVENOUS | Status: DC | PRN
  Administered 2023-05-13 – 2023-05-14 (×2): 30 [IU] via INTRAVENOUS

## 2023-05-13 MED ORDER — CEFAZOLIN SODIUM-DEXTROSE 2-4 GM/100ML-% IV SOLN
2.0000 g | INTRAVENOUS | Status: DC
  Filled 2023-05-13: qty 100

## 2023-05-13 MED ORDER — DEXAMETHASONE SODIUM PHOSPHATE 10 MG/ML IJ SOLN
INTRAMUSCULAR | Status: AC
  Filled 2023-05-13: qty 1

## 2023-05-13 MED ORDER — DIPHENHYDRAMINE HCL 50 MG/ML IJ SOLN: 12.5000 mg | INTRAMUSCULAR | Status: DC | PRN

## 2023-05-13 MED ORDER — METOCLOPRAMIDE HCL 5 MG/ML IJ SOLN
INTRAMUSCULAR | Status: AC
  Filled 2023-05-13: qty 2

## 2023-05-13 MED ORDER — FENTANYL CITRATE (PF) 100 MCG/2ML IJ SOLN
INTRAMUSCULAR | Status: DC | PRN
  Administered 2023-05-13: 100 ug via EPIDURAL

## 2023-05-13 MED ORDER — ONDANSETRON HCL 4 MG/2ML IJ SOLN
INTRAMUSCULAR | Status: AC
  Filled 2023-05-13: qty 2

## 2023-05-13 MED ORDER — SODIUM CHLORIDE 0.9 % IV SOLN
500.0000 mg | INTRAVENOUS | Status: AC
  Administered 2023-05-13: 500 mg via INTRAVENOUS

## 2023-05-13 MED ORDER — SODIUM CHLORIDE 0.9 % IR SOLN
Status: DC | PRN
  Administered 2023-05-13: 1

## 2023-05-13 SURGICAL SUPPLY — 37 items
APL PRP STRL LF DISP 70% ISPRP (MISCELLANEOUS) ×4
APL SKNCLS STERI-STRIP NONHPOA (GAUZE/BANDAGES/DRESSINGS) ×2
BENZOIN TINCTURE PRP APPL 2/3 (GAUZE/BANDAGES/DRESSINGS) ×2 IMPLANT
CHLORAPREP W/TINT 26 (MISCELLANEOUS) ×4 IMPLANT
CLAMP UMBILICAL CORD (MISCELLANEOUS) ×2 IMPLANT
CLOTH BEACON ORANGE TIMEOUT ST (SAFETY) ×2 IMPLANT
DRAPE C SECTION CLR SCREEN (DRAPES) ×2 IMPLANT
DRSG OPSITE POSTOP 4X10 (GAUZE/BANDAGES/DRESSINGS) ×2 IMPLANT
ELECT REM PT RETURN 9FT ADLT (ELECTROSURGICAL) ×2
ELECTRODE REM PT RTRN 9FT ADLT (ELECTROSURGICAL) ×2 IMPLANT
EXTRACTOR VACUUM KIWI (MISCELLANEOUS) IMPLANT
GAUZE PAD ABD 7.5X8 STRL (GAUZE/BANDAGES/DRESSINGS) IMPLANT
GAUZE SPONGE 4X4 12PLY STRL LF (GAUZE/BANDAGES/DRESSINGS) IMPLANT
GLOVE BIO SURGEON STRL SZ 6.5 (GLOVE) ×2 IMPLANT
GLOVE BIOGEL PI IND STRL 7.0 (GLOVE) ×4 IMPLANT
GLOVE SURG SS PI 6.5 STRL IVOR (GLOVE) ×2 IMPLANT
GOWN STRL REUS W/ TWL LRG LVL3 (GOWN DISPOSABLE) ×6 IMPLANT
GOWN STRL REUS W/TWL LRG LVL3 (GOWN DISPOSABLE) ×6
HEMOSTAT ARISTA ABSORB 3G PWDR (HEMOSTASIS) IMPLANT
KIT ABG SYR 3ML LUER SLIP (SYRINGE) IMPLANT
NDL HYPO 25X5/8 SAFETYGLIDE (NEEDLE) IMPLANT
NEEDLE HYPO 25X5/8 SAFETYGLIDE (NEEDLE) IMPLANT
NS IRRIG 1000ML POUR BTL (IV SOLUTION) ×2 IMPLANT
PACK C SECTION WH (CUSTOM PROCEDURE TRAY) ×2 IMPLANT
PAD OB MATERNITY 4.3X12.25 (PERSONAL CARE ITEMS) ×2 IMPLANT
RTRCTR C-SECT PINK 25CM LRG (MISCELLANEOUS) ×2 IMPLANT
STRIP CLOSURE SKIN 1/2X4 (GAUZE/BANDAGES/DRESSINGS) ×2 IMPLANT
SUT MNCRL 0 VIOLET CTX 36 (SUTURE) ×4 IMPLANT
SUT MNCRL+ AB 3-0 CT1 36 (SUTURE) ×4 IMPLANT
SUT PDS AB 0 CTX 36 PDP370T (SUTURE) ×4 IMPLANT
SUT PLAIN 0 NONE (SUTURE) IMPLANT
SUT VIC AB 2-0 CT1 27 (SUTURE)
SUT VIC AB 2-0 CT1 TAPERPNT 27 (SUTURE) IMPLANT
SUT VIC AB 4-0 KS 27 (SUTURE) ×2 IMPLANT
TOWEL OR 17X24 6PK STRL BLUE (TOWEL DISPOSABLE) ×4 IMPLANT
TRAY FOLEY W/BAG SLVR 14FR LF (SET/KITS/TRAYS/PACK) ×2 IMPLANT
WATER STERILE IRR 1000ML POUR (IV SOLUTION) ×2 IMPLANT

## 2023-05-13 NOTE — Anesthesia Preprocedure Evaluation (Signed)
Anesthesia Evaluation  Patient identified by MRN, date of birth, ID band Patient awake    Reviewed: Allergy & Precautions, Patient's Chart, lab work & pertinent test results  Airway Mallampati: II  TM Distance: >3 FB Neck ROM: Full    Dental no notable dental hx.    Pulmonary neg pulmonary ROS   Pulmonary exam normal breath sounds clear to auscultation       Cardiovascular negative cardio ROS Normal cardiovascular exam Rhythm:Regular Rate:Normal     Neuro/Psych  Headaches  negative psych ROS   GI/Hepatic negative GI ROS, Neg liver ROS,,,  Endo/Other    Morbid obesityBMI 49  Renal/GU negative Renal ROS  negative genitourinary   Musculoskeletal negative musculoskeletal ROS (+)    Abdominal  (+) + obese  Peds negative pediatric ROS (+)  Hematology  (+) Blood dyscrasia, anemia Hb 10.5, plt 167   Anesthesia Other Findings   Reproductive/Obstetrics (+) Pregnancy                              Anesthesia Physical Anesthesia Plan  ASA: 3  Anesthesia Plan: Epidural   Post-op Pain Management:    Induction:   PONV Risk Score and Plan: 2  Airway Management Planned: Natural Airway  Additional Equipment: None  Intra-op Plan:   Post-operative Plan:   Informed Consent: I have reviewed the patients History and Physical, chart, labs and discussed the procedure including the risks, benefits and alternatives for the proposed anesthesia with the patient or authorized representative who has indicated his/her understanding and acceptance.       Plan Discussed with:   Anesthesia Plan Comments:          Anesthesia Quick Evaluation

## 2023-05-13 NOTE — Progress Notes (Signed)
OB Progress Note  S: Patient resting comfortably with epidural. Consents to AROM.  O: BP 105/64   Pulse 81   Temp 98.7 F (37.1 C) (Oral)   Resp 20   Ht 5' (1.524 m)   Wt 112.7 kg   BMI 48.51 kg/m   FHT: 135bpm, moderate variablity, + accels, - decels Toco: q1-2 minutes SVE: 5.5/75/-3, sutures palpated AROM: clear, moderate, non-odorous  A/P: 27 y.o. [id number] @ [redacted]w[redacted]d admitted for induction of labor for LGA, history of shoulder dystocia, and BMI 40.   FWB: Cat. I Labor course: S/p Cytotec vaginally x 2 doses, s/p FB, AROM now Pain: Comfortable with epidural GBS: Positive -- receiving PCN Anticipate SVD  Steva Ready, DO

## 2023-05-13 NOTE — Progress Notes (Signed)
OB Progress Note  S: Patient reports feeling contractions on the right lower side.   O: BP 136/72   Pulse 87   Temp 97.7 F (36.5 C) (Oral)   Resp 18   Ht 5' (1.524 m)   Wt 112.7 kg   SpO2 98%   BMI 48.51 kg/m   FHT: 145bpm, moderate variablity, + accels, occasional variable decels Toco: q1-2 minutes SVE: 6/90/-2, sutures palpated  A/P: 27 y.o. [id number] @ [redacted]w[redacted]d admitted for induction of labor for LGA, history of shoulder dystocia, and BMI 40.   FWB: Cat. II (late decelerations improved with repositioning) Labor course: S/p Cytotec vaginally x 2 doses, s/p FB, s/p AROM at 1332, IUPC in place, will start Pitocin at 72mU/min, MVUs ~200 Pain: Comfortable with epidural GBS: Positive -- receiving PCN  Cervix unchanged at 6/90/-2 since 1443. Contractions now adequate at Pitocin of 66mU/min. We discussed continuation with titration of pitocin for a few more hours vs primary cesarean section now for failure to progress. I discussed with patient that I do have concerns for cephalopelvic disproportion. Patient desires to proceed with cesarean delivery. She confirms desires for bilateral salpingectomy at the time of cesarean delivery. L&D staff, L&D OR staff, and anesthesia aware. Ancef 2g and Azithromycin 500mg  on call to OR ordered.  I have explained to the patient that this surgery is performed to deliver their baby or babies through an incision in the abdomen and incision in the uterus.  Prior to surgery, the risks and benefits of the surgery, as well as alternative treatments were discussed.  The risks include, but are not limited to, possible need for cesarean delivery for all future pregnancies, bleeding at the time of surgery that could necessitate a blood transfusion and/or hysterectomy, rupture of the uterus during a future pregnancy that could cause a preterm delivery and/or requiring hysterectomy, infection, damage to surrounding organs and tissues, damage to bladder, damage to  ureters, causing kidney damage, and requiring additional procedures, damage to bowels, resulting in further surgery, postoperative pain, short-term and long-term, scarring on the abdominal wall and intra-abdominally, need for further surgery, development of an incisional hernia, deep vein thrombosis and/or pulmonary embolism, wound infection and/or separation, painful intercourse, urinary leakage, impact on future pregnancies including but not limited to, abnormal location or attachment of the placenta to the uterus, such as placenta previa or accreta, that may necessitate a blood transfusion and/or hysterectomy, impact on total family size, complications the course of which cannot be predicted or prevented, and death. The patient has been consented for bilateral salpingectomy. She understands that this procedure is for the purpose of permanent sterilization and is non-reversible. She understands there is a very low failure rate and that a pregnancy in the future can result in ectopic pregnancy which can be life-threatening. Patient was consented for blood products.  The patient is aware that bleeding may result in the need for a blood transfusion which includes risk of transmission of HIV (1:2 million), Hepatitis C (1:2 million), and Hepatitis B (1:200 thousand) and transfusion reaction.  Patient voiced understanding of the above risks as well as understanding of indications for blood transfusion.   Steva Ready, DO

## 2023-05-13 NOTE — Progress Notes (Signed)
OB Progress Note  S: Patient resting comfortably with epidural but is starting to feel some contractions - patient is administering a bolus.  O: BP 115/63   Pulse 86   Temp 97.7 F (36.5 C) (Oral)   Resp 16   Ht 5' (1.524 m)   Wt 112.7 kg   SpO2 98%   BMI 48.51 kg/m   FHT: 145bpm, moderate variablity, + accels, previously with late decels Toco: q2-2.5 minutes SVE: 6/90/-2 by primary RN at 1900  A/P: 27 y.o. [id number] @ [redacted]w[redacted]d admitted for induction of labor for LGA, history of shoulder dystocia, and BMI 40.   FWB: Cat. II (late decelerations improved with repositioning) Labor course: S/p Cytotec vaginally x 2 doses, s/p FB, s/p AROM at 1332, IUPC in place, will start Pitocin at 11mU/min, MVUs ~190 Pain: Comfortable with epidural GBS: Positive -- receiving PCN Mode of delivery: Unclear  We discussed her labor course in full. Patient first noted to be 6cm at 1443. Contractions are near adequate and will continue to work to titrate pitocin as tolerated. We discussed concern regarding fetal size. Will reassess for cervical change at 10pm to determine next steps. Discussed possibility of cesarean delivery.  Steva Ready, DO

## 2023-05-13 NOTE — Progress Notes (Signed)
OB Progress Note  S: Patient reports 6/10 pain with contractions. Consents to FB placement. Frustrated that her cervix remains 2cm.  O: BP (!) 125/55   Temp 97.6 F (36.4 C)   Ht 5' (1.524 m)   Wt 112.7 kg   BMI 48.51 kg/m   FHT: 145bpm, moderate variablity, + accels, - decels Toco: q1-2 minutes SVE: 2/75/-3, cervix soft, posterior, cephalic by sutures, FB placed  A/P: 27 y.o. [id number] @ [redacted]w[redacted]d admitted for induction of labor for LGA, history of shoulder dystocia, and BMI 40.  FWB: Cat. I Labor course: S/p Cytotec x 2 doses, FB bulb placed now Pain: Per patient request GBS: Positive -- s/p 3 doses of PCN Anticipate SVD  Steva Ready, DO

## 2023-05-13 NOTE — Progress Notes (Signed)
OB Progress Note  S: Patient resting comfortably with epidural. Consents to IUPC placement.  O: BP 122/74   Pulse 84   Temp 98.4 F (36.9 C) (Oral)   Resp 18   Ht 5' (1.524 m)   Wt 112.7 kg   SpO2 98%   BMI 48.51 kg/m   FHT: 145bpm, moderate variablity, + accels, occasional early decels and possible occasional late decels Toco: q1-4 minutes SVE: 6/90/-2, sutures palpated  A/P: 27 y.o. [id number] @ [redacted]w[redacted]d admitted for induction of labor for LGA, history of shoulder dystocia, and BMI 40.   FWB: Cat. II (due to possible late decelerations, contractions do not always graph clearly) - overall reassuring FHT Labor course: S/p Cytotec vaginally x 2 doses, s/p FB, s/p AROM at 1332, IUPC placed, will start Pitocin 2x2 Pain: Comfortable with epidural GBS: Positive -- receiving PCN Anticipate SVD  Steva Ready, DO

## 2023-05-13 NOTE — Anesthesia Procedure Notes (Signed)
Epidural Patient location during procedure: OB Start time: 05/13/2023 12:44 PM End time: 05/13/2023 12:52 PM  Staffing Anesthesiologist: Lannie Fields, DO Performed: anesthesiologist   Preanesthetic Checklist Completed: patient identified, IV checked, risks and benefits discussed, monitors and equipment checked, pre-op evaluation and timeout performed  Epidural Patient position: sitting Prep: DuraPrep and site prepped and draped Patient monitoring: continuous pulse ox, blood pressure, heart rate and cardiac monitor Approach: midline Location: L3-L4 Injection technique: LOR air  Needle:  Needle type: Tuohy  Needle gauge: 17 G Needle length: 9 cm Needle insertion depth: 6.5 cm Catheter type: closed end flexible Catheter size: 19 Gauge Catheter at skin depth: 12 cm Test dose: negative  Assessment Sensory level: T8 Events: blood not aspirated, no cerebrospinal fluid, injection not painful, no injection resistance, no paresthesia and negative IV test  Additional Notes Patient identified. Risks/Benefits/Options discussed with patient including but not limited to bleeding, infection, nerve damage, paralysis, failed block, incomplete pain control, headache, blood pressure changes, nausea, vomiting, reactions to medication both or allergic, itching and postpartum back pain. Confirmed with bedside nurse the patient's most recent platelet count. Confirmed with patient that they are not currently taking any anticoagulation, have any bleeding history or any family history of bleeding disorders. Patient expressed understanding and wished to proceed. All questions were answered. Sterile technique was used throughout the entire procedure. Please see nursing notes for vital signs. Test dose was given through epidural catheter and negative prior to continuing to dose epidural or start infusion. Warning signs of high block given to the patient including shortness of breath, tingling/numbness in  hands, complete motor block, or any concerning symptoms with instructions to call for help. Patient was given instructions on fall risk and not to get out of bed. All questions and concerns addressed with instructions to call with any issues or inadequate analgesia.  Reason for block:procedure for pain

## 2023-05-14 ENCOUNTER — Encounter (HOSPITAL_COMMUNITY): Payer: Self-pay | Admitting: Obstetrics and Gynecology

## 2023-05-14 LAB — CBC
HCT: 29.7 % — ABNORMAL LOW (ref 36.0–46.0)
Hemoglobin: 9.3 g/dL — ABNORMAL LOW (ref 12.0–15.0)
MCH: 28.4 pg (ref 26.0–34.0)
MCHC: 31.3 g/dL (ref 30.0–36.0)
MCV: 90.5 fL (ref 80.0–100.0)
Platelets: 170 10*3/uL (ref 150–400)
RBC: 3.28 MIL/uL — ABNORMAL LOW (ref 3.87–5.11)
RDW: 14.6 % (ref 11.5–15.5)
WBC: 16.6 10*3/uL — ABNORMAL HIGH (ref 4.0–10.5)
nRBC: 0 % (ref 0.0–0.2)

## 2023-05-14 LAB — CREATININE, SERUM
Creatinine, Ser: 0.68 mg/dL (ref 0.44–1.00)
GFR, Estimated: >60 mL/min (ref >60)

## 2023-05-14 MED ORDER — DIPHENHYDRAMINE HCL 25 MG PO CAPS
25.0000 mg | ORAL_CAPSULE | Freq: Four times a day (QID) | ORAL | Status: DC | PRN
  Administered 2023-05-14: 25 mg via ORAL
  Filled 2023-05-14: qty 1

## 2023-05-14 MED ORDER — OXYCODONE HCL 5 MG PO TABS
5.0000 mg | ORAL_TABLET | ORAL | Status: DC | PRN
  Administered 2023-05-15 – 2023-05-16 (×2): 5 mg via ORAL
  Filled 2023-05-14 (×2): qty 1

## 2023-05-14 MED ORDER — COCONUT OIL OIL: 1.0000 | TOPICAL_OIL | Status: DC | PRN

## 2023-05-14 MED ORDER — FERROUS SULFATE 325 (65 FE) MG PO TABS
325.0000 mg | ORAL_TABLET | Freq: Two times a day (BID) | ORAL | 1 refills | Status: AC
  Filled 2023-05-14: qty 100, 50d supply, fill #0

## 2023-05-14 MED ORDER — ACETAMINOPHEN 500 MG PO TABS
1000.0000 mg | ORAL_TABLET | Freq: Four times a day (QID) | ORAL | Status: DC
  Administered 2023-05-14 – 2023-05-16 (×8): 1000 mg via ORAL
  Filled 2023-05-14 (×8): qty 2

## 2023-05-14 MED ORDER — IBUPROFEN 600 MG PO TABS
600.0000 mg | ORAL_TABLET | Freq: Four times a day (QID) | ORAL | 0 refills | Status: AC | PRN
  Filled 2023-05-14: qty 30, 8d supply, fill #0

## 2023-05-14 MED ORDER — OXYCODONE HCL 5 MG PO TABS: 5.0000 mg | ORAL_TABLET | Freq: Once | ORAL | Status: DC | PRN

## 2023-05-14 MED ORDER — MORPHINE SULFATE (PF) 2 MG/ML IV SOLN: 1.0000 mg | INTRAVENOUS | Status: DC | PRN

## 2023-05-14 MED ORDER — MENTHOL 3 MG MT LOZG: 1.0000 | LOZENGE | OROMUCOSAL | Status: DC | PRN

## 2023-05-14 MED ORDER — ONDANSETRON HCL 4 MG/2ML IJ SOLN: 4.0000 mg | Freq: Once | INTRAMUSCULAR | Status: DC | PRN

## 2023-05-14 MED ORDER — METHYLERGONOVINE MALEATE 0.2 MG PO TABS
0.2000 mg | ORAL_TABLET | Freq: Four times a day (QID) | ORAL | Status: AC
  Administered 2023-05-14 – 2023-05-15 (×4): 0.2 mg via ORAL
  Filled 2023-05-14 (×4): qty 1

## 2023-05-14 MED ORDER — SENNOSIDES-DOCUSATE SODIUM 8.6-50 MG PO TABS
2.0000 | ORAL_TABLET | Freq: Every day | ORAL | Status: DC
  Administered 2023-05-15: 2 via ORAL
  Filled 2023-05-14: qty 2

## 2023-05-14 MED ORDER — PHENYLEPHRINE 80 MCG/ML (10ML) SYRINGE FOR IV PUSH (FOR BLOOD PRESSURE SUPPORT)
PREFILLED_SYRINGE | INTRAVENOUS | Status: AC
  Filled 2023-05-14: qty 10

## 2023-05-14 MED ORDER — FENTANYL CITRATE (PF) 100 MCG/2ML IJ SOLN
25.0000 ug | INTRAMUSCULAR | Status: DC | PRN
  Administered 2023-05-14: 50 ug via INTRAVENOUS

## 2023-05-14 MED ORDER — STERILE WATER FOR IRRIGATION IR SOLN
Status: DC | PRN
  Administered 2023-05-13: 1

## 2023-05-14 MED ORDER — DIPHENHYDRAMINE HCL 50 MG/ML IJ SOLN
12.5000 mg | Freq: Four times a day (QID) | INTRAMUSCULAR | Status: DC | PRN
  Administered 2023-05-14 – 2023-05-15 (×2): 12.5 mg via INTRAVENOUS
  Filled 2023-05-14: qty 1

## 2023-05-14 MED ORDER — OXYCODONE HCL 5 MG/5ML PO SOLN: 5.0000 mg | Freq: Once | ORAL | Status: DC | PRN

## 2023-05-14 MED ORDER — ZOLPIDEM TARTRATE 5 MG PO TABS: 5.0000 mg | ORAL_TABLET | Freq: Every evening | ORAL | Status: DC | PRN

## 2023-05-14 MED ORDER — ALBUMIN HUMAN 5 % IV SOLN
12.5000 g | Freq: Once | INTRAVENOUS | Status: AC
  Administered 2023-05-14: 12.5 g via INTRAVENOUS

## 2023-05-14 MED ORDER — KETOROLAC TROMETHAMINE 30 MG/ML IJ SOLN
30.0000 mg | Freq: Four times a day (QID) | INTRAMUSCULAR | Status: AC
  Administered 2023-05-14 (×4): 30 mg via INTRAVENOUS
  Filled 2023-05-14 (×4): qty 1

## 2023-05-14 MED ORDER — IBUPROFEN 600 MG PO TABS
600.0000 mg | ORAL_TABLET | Freq: Four times a day (QID) | ORAL | Status: DC
  Administered 2023-05-15 (×4): 600 mg via ORAL
  Filled 2023-05-14 (×4): qty 1

## 2023-05-14 MED ORDER — DOCUSATE SODIUM 100 MG PO CAPS
100.0000 mg | ORAL_CAPSULE | Freq: Two times a day (BID) | ORAL | 1 refills | Status: AC | PRN
  Filled 2023-05-14: qty 100, 50d supply, fill #0

## 2023-05-14 MED ORDER — FERROUS SULFATE 325 (65 FE) MG PO TABS
325.0000 mg | ORAL_TABLET | ORAL | Status: DC
  Administered 2023-05-14: 325 mg via ORAL
  Filled 2023-05-14: qty 1

## 2023-05-14 MED ORDER — ACETAMINOPHEN 160 MG/5ML PO SOLN: 325.0000 mg | ORAL | Status: DC | PRN

## 2023-05-14 MED ORDER — OXYTOCIN-SODIUM CHLORIDE 30-0.9 UT/500ML-% IV SOLN: 2.5000 [IU]/h | INTRAVENOUS | Status: AC

## 2023-05-14 MED ORDER — MEPERIDINE HCL 25 MG/ML IJ SOLN: 6.2500 mg | INTRAMUSCULAR | Status: DC | PRN

## 2023-05-14 MED ORDER — SIMETHICONE 80 MG PO CHEW: 80.0000 mg | CHEWABLE_TABLET | ORAL | Status: DC | PRN

## 2023-05-14 MED ORDER — DIBUCAINE (PERIANAL) 1 % EX OINT: 1.0000 | TOPICAL_OINTMENT | CUTANEOUS | Status: DC | PRN

## 2023-05-14 MED ORDER — FENTANYL CITRATE (PF) 100 MCG/2ML IJ SOLN
INTRAMUSCULAR | Status: AC
  Filled 2023-05-14: qty 2

## 2023-05-14 MED ORDER — WITCH HAZEL-GLYCERIN EX PADS: 1.0000 | MEDICATED_PAD | CUTANEOUS | Status: DC | PRN

## 2023-05-14 MED ORDER — LACTATED RINGERS IV SOLN: INTRAVENOUS | Status: DC

## 2023-05-14 MED ORDER — ACETAMINOPHEN 325 MG PO TABS: 325.0000 mg | ORAL_TABLET | ORAL | Status: DC | PRN

## 2023-05-14 MED ORDER — OXYCODONE HCL 5 MG PO TABS
5.0000 mg | ORAL_TABLET | Freq: Four times a day (QID) | ORAL | 0 refills | Status: AC | PRN
  Filled 2023-05-14: qty 30, 4d supply, fill #0

## 2023-05-14 MED ORDER — ENOXAPARIN SODIUM 60 MG/0.6ML IJ SOSY
50.0000 mg | PREFILLED_SYRINGE | INTRAMUSCULAR | Status: DC
  Administered 2023-05-15: 50 mg via SUBCUTANEOUS
  Filled 2023-05-14: qty 0.6

## 2023-05-14 MED ORDER — PRENATAL MULTIVITAMIN CH
1.0000 | ORAL_TABLET | Freq: Every day | ORAL | Status: DC
  Administered 2023-05-14 – 2023-05-15 (×2): 1 via ORAL
  Filled 2023-05-14 (×2): qty 1

## 2023-05-14 MED ORDER — SIMETHICONE 80 MG PO CHEW
80.0000 mg | CHEWABLE_TABLET | Freq: Three times a day (TID) | ORAL | Status: DC
  Administered 2023-05-14 – 2023-05-16 (×5): 80 mg via ORAL
  Filled 2023-05-14 (×5): qty 1

## 2023-05-14 NOTE — Progress Notes (Signed)
Postpartum Note Day #1  S:  Patient doing well.  Pain controlled.  Tolerating regular diet. Has not ambulated yet. Foley still in place. Has been working on drinking fluids more so than eating. Infant is feeding well.  Denies fevers, chills, chest pain, SOB, N/V, or worsening bilateral LE edema.  Lochia: Minimal Infant feeding:  Breast Circumcision:  N/A, female infant Contraception:  S/p bilateral salpingectomy  O: Temp:  [97.3 F (36.3 C)-99.4 F (37.4 C)] 98.4 F (36.9 C) (07/21 0545) Pulse Rate:  [72-123] 108 (07/21 0545) Resp:  [15-45] 20 (07/21 0545) BP: (98-136)/(57-93) 116/57 (07/21 0545) SpO2:  [84 %-100 %] 97 % (07/21 0545) Gen: NAD, pleasant and cooperative CV: Regular rate Resp: Normal work of breathing Abdomen: soft, non-distended, non-tender throughout Uterus: firm, non-tender, below umbilicus Incision: c/d/i, pressure bandage in place  Ext: 1+ bilateral LE edema, no bilateral calf tenderness, SCDs on and working  UOP: 100-450cc/hour  Foley: ~50cc amber colored urine in bag  Labs:     Latest Ref Rng & Units 05/14/2023   12:51 AM 05/13/2023    1:18 AM 02/12/2019    5:22 AM  CBC  WBC 4.0 - 10.5 K/uL 16.6  9.5  16.8   Hemoglobin 12.0 - 15.0 g/dL 9.3  16.1  09.6   Hematocrit 36.0 - 46.0 % 29.7  33.2  33.3   Platelets 150 - 400 K/uL 170  167  186     A/P: Patient is a 27 y.o. [id number] POD#1 s/p LTCS/BS.  S/p LTCS - Pain well controlled  - GU: UOP is adequate - GI: Tolerating regular diet - Activity: encouraged sitting up to chair and ambulation as tolerated - DVT Prophylaxis: Ambulation, prophylactic Lovenox, SCDs in bed - Labs: as above -- acute blood loss anemia, oral iron ordered  Postpartum hemorrhage - QBL 1700cc at time of delivery (mostly due to bleeding vasculature) - Methergine 0.20mg  PO q6h x 24 hours ordered - Hgb 10.5 --> 9.3, repeat CBC ordered for 7/22  Disposition:  D/C home POD#2-3.  Steva Ready, DO

## 2023-05-14 NOTE — Anesthesia Postprocedure Evaluation (Signed)
Anesthesia Post Note  Patient: Sonya Kelly  Procedure(s) Performed: CESAREAN SECTION FALLOPIAN TUBE CATHETERIZATION (Bilateral: Uterus)     Patient location during evaluation: Mother Baby Anesthesia Type: Epidural Level of consciousness: awake and alert Pain management: pain level controlled Vital Signs Assessment: post-procedure vital signs reviewed and stable Respiratory status: spontaneous breathing, nonlabored ventilation and respiratory function stable Cardiovascular status: stable Postop Assessment: no headache, no backache and epidural receding Anesthetic complications: no   No notable events documented.  Last Vitals:  Vitals:   05/14/23 0105 05/14/23 0106  BP:    Pulse: (!) 106 (!) 109  Resp: (!) 25 19  Temp:  37.4 C  SpO2: 100% 100%    Last Pain:  Vitals:   05/14/23 0106  TempSrc: Axillary  PainSc:    Pain Goal:    LLE Motor Response: Purposeful movement (05/14/23 0100) LLE Sensation: Tingling (05/14/23 0100) RLE Motor Response: Purposeful movement (05/14/23 0100) RLE Sensation: Tingling (05/14/23 0100)     Epidural/Spinal Function Cutaneous sensation: Tingles (05/14/23 0030), Patient able to flex knees: Yes (05/14/23 0030), Patient able to lift hips off bed: Yes (05/14/23 0030), Back pain beyond tenderness at insertion site: No (05/14/23 0030), Progressively worsening motor and/or sensory loss: No (05/14/23 0030), Bowel and/or bladder incontinence post epidural: No (05/14/23 0030)  Somalia Segler

## 2023-05-14 NOTE — Op Note (Addendum)
Pre Op Dx:   1. Single live IUP at [redacted]w[redacted]d 2. LGA fetus 3. History of shoulder dystocia 4. Desires permanent sterilization  Post Op Dx:  1. Single live IUP at [redacted]w[redacted]d 2. LGA fetus 3. History of shoulder dystocia 4. Cephalopelvic disproportion  5. Desires permanent sterilization  Procedure:   1. Low Transverse Cesarean Section 2. Bilateral salpingectomy  Surgeon:  Dr. Katrinka Blazing. Connye Burkitt Assistants:  Dale [place], FNP Anesthesia:  Epidural  EBL:  1700cc  IVF:  2000cc UOP:  100cc  Drains:  Foley catheter  Specimen removed:  Placenta - sent to L&D  Bilateral fallopian tubes - sent to pathology Device(s) implanted:  None Case Type:  Clean-contaminated Findings: Normal-appearing uterus, bilateral fallopian tubes, and ovaries. Fetus in cephalic position with clear amniotic fluid. Narrow pelvis. APGAR: 8/9. Infant weight: 3970g (8lb 12oz). Complications: None Indications:  27 y.o. [id number] undergoing IOL at [redacted]w[redacted]d for LGA fetus and history of shoulder dystocia with failure to progress and suspected CPD.  Procedure:  After informed consent was obtained, the patient was brought to the operating room.  Following confirmation of adequate epidural anesthesia, the patient was positioned in dorsal supine position with a leftward tilt and was prepped and draped in sterile fashion.  A preoperative time-out was performed.  The abdomen was entered in layers through a pfannenstiel incision and an Network engineer was placed. A bladder flap was created sharply.  A low transverse hysterotomy was created sharply to the level of the membranes, then extended bluntly. The fetus was delivered from cephalic presentation onto the field.  Bulb suctioning was performed.  The cord was doubly clamped and cut after a 60 second pause.  The newborn was passed to the warmer.  The placenta was delivered. Bleeding vessels were noted at each apex of the hysterotomy and these were clamped. The uterus was swept free of clots and  debris and closed in a running locked fashion with 0-Monocryl.  Hemostasis was verified. The left fallopian tube was removed from its attachment to the normal-appearing left ovary starting at the fimbria and divided along the mesosalpinx to the level of the uterus using the Ligasure device. The tube was desiccated thoroughly and divided from the uterus. The specimen was removed.  This process was repeated on the contralateral side. Two figure-of-eight sutures were placed mid-hysterotomy and at the left lateral portion of the hysterotomy for additional hemostasis. TXA 1g IV x 1 was given intraoperatively. The abdomen was irrigated with warmed saline and cleared of clots. Arista hemostatic powder placed atop the uterus. The peritoneum was closed in a running fashion with 2-0 Vicryl.  Subfascial spaces were inspected and hemostasis assured.  The fascia was closed in a running fashion with 0-PDS. Exparel (20cc) was injected into the fascial and subcutaneous tissues. The subcutaneous tissues were irrigated and hemostasis assured.  The subcutaneous tissues were closed with 3-0 Monocryl.  The skin was closed with 4-0 Vicryl.  A sterile bandage was applied.  The patient was transferred to PACU.  All needle, sponge, and instrument counts were correct at the end of the case.    Disposition:  PACU  Comments: I performed the procedure and the assistant was needed due to the complexity of the anatomy. An experienced assistant was required given the standard of surgical care given the complexity of the case.  This assistant was needed for exposure, dissection, suctioning, retraction, instrument exchange, assisting with delivery with administration of fundal pressure, and for overall help during the procedure.  Steva Ready, DO

## 2023-05-14 NOTE — Transfer of Care (Signed)
Immediate Anesthesia Transfer of Care Note  Patient: Sonya Kelly  Procedure(s) Performed: CESAREAN SECTION FALLOPIAN TUBE CATHETERIZATION (Bilateral: Uterus)  Patient Location: PACU  Anesthesia Type:Epidural  Level of Consciousness: awake and patient cooperative  Airway & Oxygen Therapy: Patient Spontanous Breathing  Post-op Assessment: Report given to RN and Post -op Vital signs reviewed and stable  Post vital signs: Reviewed and stable  Last Vitals:  Vitals Value Taken Time  BP 103/60 05/14/23 0030  Temp    Pulse 117 05/14/23 0033  Resp 25 05/14/23 0033  SpO2 100 % 05/14/23 0033  Vitals shown include unfiled device data.  Last Pain:  Vitals:   05/14/23 0000  TempSrc:   PainSc: 8          Complications: No notable events documented.

## 2023-05-15 ENCOUNTER — Other Ambulatory Visit: Payer: Self-pay | Admitting: Pediatrics

## 2023-05-15 ENCOUNTER — Other Ambulatory Visit (HOSPITAL_COMMUNITY): Payer: Self-pay

## 2023-05-15 LAB — CBC
HCT: 19.2 % — ABNORMAL LOW (ref 36.0–46.0)
HCT: 24.2 % — ABNORMAL LOW (ref 36.0–46.0)
Hemoglobin: 6 g/dL — CL (ref 12.0–15.0)
Hemoglobin: 7.8 g/dL — ABNORMAL LOW (ref 12.0–15.0)
MCH: 28.4 pg (ref 26.0–34.0)
MCH: 28.6 pg (ref 26.0–34.0)
MCHC: 31.3 g/dL (ref 30.0–36.0)
MCHC: 32.2 g/dL (ref 30.0–36.0)
MCV: 91 fL (ref 80.0–100.0)
MCV: 88.6 fL (ref 80.0–100.0)
Platelets: 160 10*3/uL (ref 150–400)
Platelets: 185 10*3/uL (ref 150–400)
RBC: 2.11 MIL/uL — ABNORMAL LOW (ref 3.87–5.11)
RBC: 2.73 MIL/uL — ABNORMAL LOW (ref 3.87–5.11)
RDW: 14.8 % (ref 11.5–15.5)
RDW: 15.2 % (ref 11.5–15.5)
WBC: 12.4 10*3/uL — ABNORMAL HIGH (ref 4.0–10.5)
WBC: 13.6 10*3/uL — ABNORMAL HIGH (ref 4.0–10.5)
nRBC: 0 % (ref 0.0–0.2)
nRBC: 0 % (ref 0.0–0.2)

## 2023-05-15 LAB — BPAM RBC
Unit Type and Rh: 6200
Unit Type and Rh: 6200

## 2023-05-15 LAB — TYPE AND SCREEN: Antibody Screen: NEGATIVE

## 2023-05-15 LAB — PREPARE RBC (CROSSMATCH)

## 2023-05-15 MED ORDER — FUROSEMIDE 10 MG/ML IJ SOLN
20.0000 mg | Freq: Once | INTRAMUSCULAR | Status: AC
  Administered 2023-05-15: 20 mg via INTRAVENOUS
  Filled 2023-05-15: qty 4

## 2023-05-15 MED ORDER — SODIUM CHLORIDE 0.9% IV SOLUTION: Freq: Once | INTRAVENOUS | Status: AC

## 2023-05-15 NOTE — Progress Notes (Signed)
Postpartum Note Day #2  S:  Patient doing well.  Pain controlled.  Tolerating regular diet. Ambulating and voiding without issue. She has not had any fatigue, light-headedness/dizziness. She is open to receiving a blood transfusion. Denies fevers, chills, chest pain, SOB, N/V, or worsening bilateral LE edema.  Lochia: Moderate Infant feeding:  Breast Circumcision:  N/A, female infant Contraception:  S/p bilateral salpingectomy  O: Temp:  [98.3 F (36.8 C)-98.4 F (36.9 C)] 98.4 F (36.9 C) (07/22 0533) Pulse Rate:  [99] 99 (07/21 2039) Resp:  [20] 20 (07/22 0533) BP: (91-101)/(52-64) 101/52 (07/22 0533) SpO2:  [99 %-100 %] 100 % (07/22 0533) Gen: NAD, pleasant and cooperative CV: Regular rate Resp: Normal work of breathing Abdomen: soft, non-distended, non-tender throughout Uterus: firm, non-tender, below umbilicus Incision: c/d/i, pressure bandage in place  Ext: 1+ bilateral LE edema, no bilateral calf tenderness, SCDs on and working  Labs:     Latest Ref Rng & Units 05/15/2023    3:56 AM 05/14/2023   12:51 AM 05/13/2023    1:18 AM  CBC  WBC 4.0 - 10.5 K/uL 12.4  16.6  9.5   Hemoglobin 12.0 - 15.0 g/dL 6.0  9.3  16.1   Hematocrit 36.0 - 46.0 % 19.2  29.7  33.2   Platelets 150 - 400 K/uL 160  170  167     A/P: Patient is a 27 y.o. [id number] POD#2 s/p LTCS/BS.  S/p LTCS - Pain well controlled  - GU: UOP is adequate - GI: Tolerating regular diet - Activity: encouraged sitting up to chair and ambulation as tolerated - DVT Prophylaxis: Ambulation, prophylactic Lovenox, SCDs in bed - Labs: as above -- acute blood loss anemia, oral iron ordered  Postpartum hemorrhage - QBL 1700cc at time of delivery (mostly due to bleeding vasculature) - S/p 24h PO Methergine series - Hgb 10.5 --> 9.3 --> 6.0 today - Discussed r/b/a to blood transfusion vs iron infusion - recommend blood transfusion which patient is in agreement. Patient has been previously consented for blood. - 2u pRBCs  ordered  Disposition:  D/C home today or tomorrow pending infant discharge and pending how she feels after blood transfusion.  Steva Ready, DO

## 2023-05-15 NOTE — Discharge Summary (Shared)
Postpartum Discharge Summary  Date of Service: 05/16/23     Patient Name: Sonya Kelly DOB: 12-11-1995 MRN: [id number]  Date of admission: 05/12/2023 Delivery date:05/13/2023 Delivering provider: Steva Ready Date of discharge: 05/16/2023  Admitting diagnosis: LGA (large for gestational age) fetus affecting management of mother [O36.60X0] History of shoulder dystocia Obesity (BMI 40) Intrauterine pregnancy: [redacted]w[redacted]d     Secondary diagnosis:  Principal Problem:   LGA (large for gestational age) fetus affecting management of mother History of shoulder dystocia Obesity (BMI 40) Additional problems: History of OVD, Abnormal glucola with normal 3h GTT, Headaches, Desires permanent sterilization    Discharge diagnosis: Term Pregnancy Delivered                                              Post partum procedures:blood transfusion Augmentation: AROM, Pitocin, Cytotec, and IP Foley Complications: Hemorrhage>1057mL  Hospital course: Induction of Labor With Cesarean Section   27 y.o. yo [phone] at [redacted]w[redacted]d was admitted to the hospital 05/12/2023 for induction of labor. Patient had a labor course significant for failure to progress. The patient went for cesarean section due to Arrest of Dilation. Delivery details are as follows: Membrane Rupture Time/Date: 11:13 PM,05/13/2023  Delivery Method:C-Section, Low Transverse Details of operation can be found in separate operative Note.  Patient had a postpartum course complicated by acute blood loss anemia with Hgb of 6.0 (from 10.5). Patient did not have any symptoms but did have moderate lochia. Recommended blood transfusion which patient accepted. She received 2 units of packed red blood cells on 05/15/23 and her Hgb 4 hours post-transfusion was 7.8. She is ambulating, tolerating a regular diet, passing flatus, and urinating well.  Patient is discharged home in stable condition on 05/16/23.      Newborn Data: Birth date:05/13/2023 Birth time:11:14  PM Gender:Female Living status:Living Apgars:8 ,9  Weight:3970 g                               Magnesium Sulfate received: No BMZ received: No Rhophylac:N/A MMR:N/A T-DaP:Given prenatally Flu: N/A Transfusion:Yes - 2u pRBCs  Physical exam  Vitals:   05/15/23 1259 05/15/23 1450 05/15/23 2030 05/16/23 0524  BP: 104/65 (!) 103/90 111/67 (!) 98/53  Pulse: 99 100 92 88  Resp: 17 16 16 20   Temp: 97.6 F (36.4 C) 98.1 F (36.7 C) 97.8 F (36.6 C) 98.1 F (36.7 C)  TempSrc: Oral Oral Oral Oral  SpO2: 100% 100%  100%  Weight:      Height:       General: alert, cooperative, and no distress Lochia: appropriate Uterine Fundus: firm Incision: Dressing is clean, dry, and intact DVT Evaluation: No evidence of DVT seen on physical exam. No cords or calf tenderness. Labs: Lab Results  Component Value Date   WBC 13.6 (H) 05/15/2023   HGB 7.8 (L) 05/15/2023   HCT 24.2 (L) 05/15/2023   MCV 88.6 05/15/2023   PLT 185 05/15/2023      Latest Ref Rng & Units 05/14/2023   12:51 AM  CMP  Creatinine 0.44 - 1.00 mg/dL 1.47    Edinburgh Score:    05/14/2023    2:30 AM  Edinburgh Postnatal Depression Scale Screening Tool  I have been able to laugh and see the funny side of things. 0  I have looked forward with enjoyment  to things. 0  I have blamed myself unnecessarily when things went wrong. 2  I have been anxious or worried for no good reason. 1  I have felt scared or panicky for no good reason. 0  Things have been getting on top of me. 1  I have been so unhappy that I have had difficulty sleeping. 2  I have felt sad or miserable. 0  I have been so unhappy that I have been crying. 0  The thought of harming myself has occurred to me. 0  Edinburgh Postnatal Depression Scale Total 6      After visit meds:  Allergies as of 05/16/2023   No Known Allergies      Medication List     STOP taking these medications    calcium carbonate 500 MG chewable tablet Commonly known as:  TUMS - dosed in mg elemental calcium   cyanocobalamin 500 MCG tablet Commonly known as: VITAMIN B12   VITAMIN B-12 PO   VyLibra 0.25-35 MG-MCG tablet Generic drug: norgestimate-ethinyl estradiol       TAKE these medications    docusate sodium 100 MG capsule Commonly known as: Colace Take 1 capsule (100 mg total) by mouth 2 (two) times daily as needed for mild constipation. What changed:  when to take this reasons to take this   FeroSul 325 (65 Fe) MG tablet Generic drug: ferrous sulfate Take 1 tablet (325 mg total) by mouth 2 (two) times daily with a meal.   ibuprofen 600 MG tablet Commonly known as: ADVIL Take 1 tablet (600 mg total) by mouth every 6 (six) hours as needed for moderate pain, cramping or mild pain. What changed: reasons to take this   oxyCODONE 5 MG immediate release tablet Commonly known as: Oxy IR/ROXICODONE Take 1-2 tablets (5-10 mg total) by mouth every 6 (six) hours as needed for severe pain or breakthrough pain.   prenatal multivitamin Tabs tablet Take 1 tablet by mouth daily.               Discharge Care Instructions  (From admission, onward)           Start     Ordered   05/14/23 0000  Change dressing (specify)       Comments: Remove your honeycomb dressing and steri strips on 05/18/2023 in the shower.   05/14/23 1053             Discharge home in stable condition Infant Feeding: Breast Infant Disposition:home with mother Discharge instruction: per After Visit Summary and Postpartum booklet. Activity: Advance as tolerated. Pelvic rest for 6 weeks.  Diet: routine diet Anticipated Birth Control:  S/p bilateral salpingectomyu Postpartum Appointment:6 weeks Additional Postpartum F/U: Incision check 2 weeks Future Appointments:No future appointments. Follow up Visit:  Follow-up Information     Steva Ready, DO Follow up in 2 week(s).   Specialty: Obstetrics and Gynecology Why: Our office will schedule a 2 week  incision check for you. Please keep your previously scheduled 6 week postpartum visit. Contact information: [address] [place] [address] [phone]                     05/16/2023 Steva Ready, DO

## 2023-05-16 ENCOUNTER — Encounter (HOSPITAL_COMMUNITY): Payer: Self-pay | Admitting: Obstetrics and Gynecology

## 2023-05-16 LAB — TYPE AND SCREEN
Unit division: 0
Unit division: 0

## 2023-05-16 LAB — BPAM RBC
Blood Product Expiration Date: 202407282359
Blood Product Expiration Date: 202408132359
ISSUE DATE / TIME: 202407220922
ISSUE DATE / TIME: 202407221235

## 2023-05-16 LAB — SURGICAL PATHOLOGY

## 2023-05-23 ENCOUNTER — Other Ambulatory Visit (HOSPITAL_COMMUNITY): Payer: Self-pay

## 2023-06-08 ENCOUNTER — Telehealth (HOSPITAL_COMMUNITY): Payer: Self-pay

## 2023-06-08 NOTE — Telephone Encounter (Signed)
06/08/2023 1950  Name: Sonya Kelly MRN: [id number] DOB: 01/24/1996  Reason for Call:  Transition of Care Hospital Discharge Call  Contact Status: Patient Contact Status: Complete  Language assistant needed: Interpreter Mode: Interpreter Not Needed        Follow-Up Questions: Do You Have Any Concerns About Your Health As You Heal From Delivery?: No Do You Have Any Concerns About Your Infants Health?: No  Patient states that she is doing a lot better. She states that she has some soreness around the incisional area but that it is improving. She states she takes Advil as needed for pain. RN reviewed signs on infection to watch for. Patient declines any other questions or concerns about her health or healing.   Edinburgh Postnatal Depression Scale:  In the Past 7 Days: I have been able to laugh and see the funny side of things.: As much as I always could I have looked forward with enjoyment to things.: As much as I ever did I have blamed myself unnecessarily when things went wrong.: Not very often I have been anxious or worried for no good reason.: Hardly ever I have felt scared or panicky for no good reason.: No, not at all Things have been getting on top of me.: No, I have been coping as well as ever I have been so unhappy that I have had difficulty sleeping.: Not at all I have felt sad or miserable.: No, not at all I have been so unhappy that I have been crying.: No, never The thought of harming myself has occurred to me.: Never [name] Postnatal Depression Scale Total: 2  PHQ2-9 Depression Scale:     Discharge Follow-up: Edinburgh score requires follow up?: No Patient was advised of the following resources:: Breastfeeding Support Group, Support Group Did patient express any COVID concerns?: No  Post-discharge interventions: Reviewed Newborn Safe Sleep Practices  Signature Signe Colt

## 2023-07-03 ENCOUNTER — Other Ambulatory Visit: Payer: Self-pay | Admitting: Internal Medicine

## 2023-07-03 DIAGNOSIS — E041 Nontoxic single thyroid nodule: Secondary | ICD-10-CM

## 2023-07-06 ENCOUNTER — Other Ambulatory Visit: Payer: Medicaid Other

## 2023-07-06 ENCOUNTER — Ambulatory Visit
Admission: RE | Admit: 2023-07-06 | Discharge: 2023-07-06 | Disposition: A | Discharge status: Home / Self Care | Payer: Medicaid Other | Source: Ambulatory Visit | Attending: Internal Medicine | Admitting: Internal Medicine

## 2023-07-06 DIAGNOSIS — E041 Nontoxic single thyroid nodule: Secondary | ICD-10-CM

## 2024-07-03 ENCOUNTER — Ambulatory Visit (INDEPENDENT_AMBULATORY_CARE_PROVIDER_SITE_OTHER): Admitting: Dermatology

## 2024-07-03 ENCOUNTER — Encounter: Payer: Self-pay | Admitting: Dermatology

## 2024-07-03 VITALS — BP 107/64

## 2024-07-03 DIAGNOSIS — L821 Other seborrheic keratosis: Secondary | ICD-10-CM

## 2024-07-03 DIAGNOSIS — E538 Deficiency of other specified B group vitamins: Secondary | ICD-10-CM | POA: Insufficient documentation

## 2024-07-03 DIAGNOSIS — L814 Other melanin hyperpigmentation: Secondary | ICD-10-CM | POA: Diagnosis not present

## 2024-07-03 DIAGNOSIS — D2371 Other benign neoplasm of skin of right lower limb, including hip: Secondary | ICD-10-CM

## 2024-07-03 DIAGNOSIS — E042 Nontoxic multinodular goiter: Secondary | ICD-10-CM | POA: Insufficient documentation

## 2024-07-03 DIAGNOSIS — N946 Dysmenorrhea, unspecified: Secondary | ICD-10-CM | POA: Insufficient documentation

## 2024-07-03 DIAGNOSIS — D500 Iron deficiency anemia secondary to blood loss (chronic): Secondary | ICD-10-CM | POA: Insufficient documentation

## 2024-07-03 DIAGNOSIS — Z6841 Body Mass Index (BMI) 40.0 and over, adult: Secondary | ICD-10-CM | POA: Insufficient documentation

## 2024-07-03 DIAGNOSIS — L853 Xerosis cutis: Secondary | ICD-10-CM

## 2024-07-03 DIAGNOSIS — E049 Nontoxic goiter, unspecified: Secondary | ICD-10-CM | POA: Insufficient documentation

## 2024-07-03 DIAGNOSIS — D239 Other benign neoplasm of skin, unspecified: Secondary | ICD-10-CM

## 2024-07-03 NOTE — Progress Notes (Signed)
 New Patient Visit   Subjective  Sonya Kelly is a 28 y.o. female who presents for a NEW PATIENT appointment to be examined for the concerns as listed below.   Spot Check: Pt has 3 spots that she would like evaluated. She has 1 over the R breast and 2 on the posterior R thigh. She believes they may be related to her pregnancy from last year.    Add On: Discussion for removal of moles located on the neck   Are you nursing, pregnant or trying to conceive? No   Patient denied Hx of Bx. Patient family Hx of skin cancer.   The following portions of the chart were reviewed this encounter and updated as appropriate: medications, allergies, medical history  Review of Systems:  No other skin or systemic complaints except as noted in HPI or Assessment and Plan.  Objective  Well appearing patient in no apparent distress; mood and affect are within normal limits.   A focused examination was performed of the following areas: R breast & post R thigh   Relevant exam findings are noted in the Assessment and Plan.             Assessment & Plan   1. Lentigo on Right Breast - Assessment: 6 mm light brown macule on the right breast, consistent with a lentigo. This benign lesion likely appeared during pregnancy, which is a common occurrence. The lesion does not exhibit characteristics concerning for skin cancer or melanoma. - Plan:    Document lesion with clinical photography    Annual to biannual monitoring for growth or changes  2. Dermatofibromas on Right Thigh - Assessment: Two lesions on the right posterior thigh, measuring 6 mm (superior) and 7 mm (inferior), consistent with early dermatofibromas. These benign lesions typically occur after minor skin injuries such as shaving or insect bites. They may become firmer over time but are not dangerous. - Plan:    Document lesions with clinical photography    Patient education provided on the benign nature of dermatofibromas     Advise against manipulation of the lesions to prevent potential enlargement  3. Dry Skin Prevention - Assessment: Patient reports using Jergens lotion or Aquaphor for skin moisturization. Education on proper moisturizing techniques and product selection is warranted to prevent dry skin and associated issues. - Plan:    Recommend daily application of moisturizer immediately after showering    Provide samples of La Roche-Posay Lipikar and Eucerin Advanced Repair Lotion    Educate on the difference between occlusive and hydrating moisturizers  4. Dermatosis Papulosa Nigra - Assessment: Patient inquired about moles on the neck, which were identified as dermatosis papulosa nigra. These are benign, genetic lesions that are not dangerous but may be of cosmetic concern to the patient. - Plan:    Educate patient on the benign nature of DPN    Discuss cosmetic removal options including snipping larger lesions and cauterizing the base, or zapping smaller lesions    Explain healing process, including temporary flat brown spots and use of lightening cream    Inform that removal is considered cosmetic and not covered by insurance    Provide cost estimate starting at $200 for up to 15 lesions    Schedule procedure during 12:30 PM slot if patient decides to proceed    Require 50% deposit at scheduling, remainder due on procedure day   Return if symptoms worsen or fail to improve.   Documentation: I have reviewed the above documentation for  accuracy and completeness, and I agree with the above.  I, Shirron Maranda, CMA, am acting as scribe for [employer], DO.   Delon Lenis, DO

## 2024-07-03 NOTE — Patient Instructions (Addendum)
 Date: Wed Jul 03 2024  Hello Sonya Kelly,  Thank you for visiting today. Here is a summary of the key instructions:  - Skin Care:   - Apply lotion daily, especially right after showering   - Use a moisturizer with urea or ceramides for better hydration   - Samples of La Roche-Posay Lipikar and Eucerin Advanced Repair Lotion will be provided  - Follow-up:   - Annual or 58-month check-up to monitor the spots on right breast and right thigh  - Other Instructions:   - Removal of dermatosis papulosis nigra (DPNs) on neck is available   - Cost starts at $200 for up to 15 moles   - Procedure involves numbing cream, snipping, and cauterizing   - Healing process includes temporary flat brown spots   - If interested, schedule a 12:30 PM appointment slot   - Pay half the cost when making the appointment, and the other half on the day of the procedure  Please reach out if you have any questions or concerns.  Warm regards,  Dr. Delon Lenis Dermatology      Cosmetic Removal Quote:  $200 to remove up to 15 lesions $300 to remove 16 to 25 lesions $400 to removal 26 to 35 lesions $10 per each additional lesion over 35  ** $100 deposit required to schedule **   Important Information  Due to recent changes in healthcare laws, you may see results of your pathology and/or laboratory studies on MyChart before the doctors have had a chance to review them. We understand that in some cases there may be results that are confusing or concerning to you. Please understand that not all results are received at the same time and often the doctors may need to interpret multiple results in order to provide you with the best plan of care or course of treatment. Therefore, we ask that you please give us  2 business days to thoroughly review all your results before contacting the office for clarification. Should we see a critical lab result, you will be contacted sooner.   If You Need Anything After Your  Visit  If you have any questions or concerns for your doctor, please call our main line at [phone] If no one answers, please leave a voicemail as directed and we will return your call as soon as possible. Messages left after 4 pm will be answered the following business day.   You may also send us  a message via MyChart. We typically respond to MyChart messages within 1-2 business days.  For prescription refills, please ask your pharmacy to contact our office. Our fax number is [phone].  If you have an urgent issue when the clinic is closed that cannot wait until the next business day, you can page your doctor at the number below.    Please note that while we do our best to be available for urgent issues outside of office hours, we are not available 24/7.   If you have an urgent issue and are unable to reach us , you may choose to seek medical care at your doctor's office, retail clinic, urgent care center, or emergency room.  If you have a medical emergency, please immediately call 911 or go to the emergency department. In the event of inclement weather, please call our main line at [phone] for an update on the status of any delays or closures.  Dermatology Medication Tips: Please keep the boxes that topical medications come in in order to help keep track of  the instructions about where and how to use these. Pharmacies typically print the medication instructions only on the boxes and not directly on the medication tubes.   If your medication is too expensive, please contact our office at [phone] or send us  a message through MyChart.   We are unable to tell what your co-pay for medications will be in advance as this is different depending on your insurance coverage. However, we may be able to find a substitute medication at lower cost or fill out paperwork to get insurance to cover a needed medication.   If a prior authorization is required to get your medication covered by  your insurance company, please allow us  1-2 business days to complete this process.  Drug prices often vary depending on where the prescription is filled and some pharmacies may offer cheaper prices.  The website www.goodrx.com contains coupons for medications through different pharmacies. The prices here do not account for what the cost may be with help from insurance (it may be cheaper with your insurance), but the website can give you the price if you did not use any insurance.  - You can print the associated coupon and take it with your prescription to the pharmacy.  - You may also stop by our office during regular business hours and pick up a GoodRx coupon card.  - If you need your prescription sent electronically to a different pharmacy, notify our office through [facility] or by phone at [phone]
